# Patient Record
Sex: Female | Born: 1980 | Race: White | Hispanic: No | Marital: Married | Smoking: Former smoker
Health system: Southern US, Community
[De-identification: ages and names within clinical notes are randomized; demographics above are authoritative.]

## PROBLEM LIST (undated history)

## (undated) DIAGNOSIS — R51 Headache: Secondary | ICD-10-CM

## (undated) DIAGNOSIS — R519 Headache, unspecified: Secondary | ICD-10-CM

## (undated) DIAGNOSIS — I369 Nonrheumatic tricuspid valve disorder, unspecified: Secondary | ICD-10-CM

## (undated) DIAGNOSIS — I341 Nonrheumatic mitral (valve) prolapse: Secondary | ICD-10-CM

## (undated) DIAGNOSIS — R011 Cardiac murmur, unspecified: Secondary | ICD-10-CM

## (undated) DIAGNOSIS — Q228 Other congenital malformations of tricuspid valve: Secondary | ICD-10-CM

## (undated) HISTORY — PX: ABDOMINAL HYSTERECTOMY: SHX81

---
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
## 1999-01-17 HISTORY — PX: DILATION AND CURETTAGE OF UTERUS: SHX78

## 2000-01-17 HISTORY — PX: WISDOM TOOTH EXTRACTION: SHX21

## 2004-01-29 ENCOUNTER — Ambulatory Visit: Payer: Self-pay | Admitting: Obstetrics and Gynecology

## 2004-12-23 ENCOUNTER — Ambulatory Visit: Payer: Self-pay | Admitting: Obstetrics and Gynecology

## 2005-03-20 ENCOUNTER — Emergency Department: Payer: Self-pay | Admitting: Internal Medicine

## 2005-03-20 ENCOUNTER — Other Ambulatory Visit: Payer: Self-pay

## 2005-05-10 ENCOUNTER — Ambulatory Visit: Payer: Self-pay | Admitting: Obstetrics and Gynecology

## 2005-08-11 ENCOUNTER — Observation Stay: Payer: Self-pay

## 2005-08-14 ENCOUNTER — Inpatient Hospital Stay: Payer: Self-pay | Admitting: Obstetrics and Gynecology

## 2006-01-02 ENCOUNTER — Other Ambulatory Visit: Payer: Self-pay

## 2007-01-15 ENCOUNTER — Ambulatory Visit: Payer: Self-pay

## 2007-04-15 ENCOUNTER — Ambulatory Visit: Payer: Self-pay | Admitting: Anesthesiology

## 2007-05-16 ENCOUNTER — Ambulatory Visit: Payer: Self-pay | Admitting: Anesthesiology

## 2007-06-05 ENCOUNTER — Ambulatory Visit: Payer: Self-pay | Admitting: Anesthesiology

## 2007-06-20 ENCOUNTER — Ambulatory Visit: Payer: Self-pay | Admitting: Anesthesiology

## 2007-06-24 ENCOUNTER — Ambulatory Visit: Payer: Self-pay | Admitting: Anesthesiology

## 2010-03-05 ENCOUNTER — Ambulatory Visit: Payer: Self-pay | Admitting: Internal Medicine

## 2011-10-06 ENCOUNTER — Ambulatory Visit: Payer: Self-pay | Admitting: Family

## 2011-10-12 ENCOUNTER — Ambulatory Visit: Payer: Self-pay | Admitting: Family

## 2013-06-18 ENCOUNTER — Emergency Department: Payer: Self-pay | Admitting: Emergency Medicine

## 2013-06-18 LAB — URINALYSIS, COMPLETE
BACTERIA: NONE SEEN
BLOOD: NEGATIVE
Bilirubin,UR: NEGATIVE
Glucose,UR: NEGATIVE mg/dL (ref 0–75)
KETONE: NEGATIVE
LEUKOCYTE ESTERASE: NEGATIVE
Nitrite: NEGATIVE
PH: 7 (ref 4.5–8.0)
Protein: NEGATIVE
RBC,UR: 1 /HPF (ref 0–5)
Specific Gravity: 1.004 (ref 1.003–1.030)
Squamous Epithelial: NONE SEEN
WBC UR: <1 /HPF (ref 0–5)

## 2013-06-18 LAB — CBC
HCT: 39.4 %
HGB: 13.1 g/dL
MCH: 31.8 pg
MCHC: 33.3 g/dL
MCV: 96 fL
Platelet: 170 x10 3/mm 3
RBC: 4.12 X10 6/mm 3
RDW: 13.9 %
WBC: 4.5 x10 3/mm 3

## 2013-06-18 LAB — COMPREHENSIVE METABOLIC PANEL WITH GFR
Albumin: 4.4 g/dL
Alkaline Phosphatase: 53 U/L
Anion Gap: 5 — ABNORMAL LOW
BUN: 8 mg/dL
Bilirubin,Total: 0.4 mg/dL
Calcium, Total: 8.8 mg/dL
Chloride: 106 mmol/L
Co2: 29 mmol/L
Creatinine: 0.66 mg/dL
EGFR (African American): >60
EGFR (Non-African Amer.): >60
Glucose: 85 mg/dL
Osmolality: 277
Potassium: 3.7 mmol/L
SGOT(AST): 17 U/L
SGPT (ALT): 15 U/L
Sodium: 140 mmol/L
Total Protein: 7.3 g/dL

## 2013-06-18 LAB — LIPASE, BLOOD: Lipase: 187 U/L

## 2015-11-05 DIAGNOSIS — G8929 Other chronic pain: Secondary | ICD-10-CM | POA: Insufficient documentation

## 2015-12-02 ENCOUNTER — Other Ambulatory Visit: Payer: Self-pay | Admitting: Obstetrics and Gynecology

## 2015-12-02 DIAGNOSIS — Z1231 Encounter for screening mammogram for malignant neoplasm of breast: Secondary | ICD-10-CM

## 2016-01-11 ENCOUNTER — Encounter
Admission: RE | Disposition: A | Discharge status: Home / Self Care | Payer: Self-pay | Source: Ambulatory Visit | Attending: Obstetrics & Gynecology

## 2016-01-11 ENCOUNTER — Encounter: Payer: Self-pay | Admitting: *Deleted

## 2016-01-11 ENCOUNTER — Ambulatory Visit: Payer: BLUE CROSS/BLUE SHIELD | Admitting: Anesthesiology

## 2016-01-11 ENCOUNTER — Ambulatory Visit
Admission: RE | Admit: 2016-01-11 | Discharge: 2016-01-11 | Disposition: A | Discharge status: Home / Self Care | Payer: BLUE CROSS/BLUE SHIELD | Source: Ambulatory Visit | Attending: Obstetrics & Gynecology | Admitting: Obstetrics & Gynecology

## 2016-01-11 DIAGNOSIS — Z882 Allergy status to sulfonamides status: Secondary | ICD-10-CM | POA: Insufficient documentation

## 2016-01-11 DIAGNOSIS — D251 Intramural leiomyoma of uterus: Secondary | ICD-10-CM | POA: Insufficient documentation

## 2016-01-11 DIAGNOSIS — Z87891 Personal history of nicotine dependence: Secondary | ICD-10-CM | POA: Diagnosis not present

## 2016-01-11 DIAGNOSIS — I341 Nonrheumatic mitral (valve) prolapse: Secondary | ICD-10-CM | POA: Diagnosis not present

## 2016-01-11 DIAGNOSIS — N938 Other specified abnormal uterine and vaginal bleeding: Secondary | ICD-10-CM | POA: Insufficient documentation

## 2016-01-11 DIAGNOSIS — Z888 Allergy status to other drugs, medicaments and biological substances status: Secondary | ICD-10-CM | POA: Diagnosis not present

## 2016-01-11 DIAGNOSIS — Z91040 Latex allergy status: Secondary | ICD-10-CM | POA: Diagnosis not present

## 2016-01-11 DIAGNOSIS — R011 Cardiac murmur, unspecified: Secondary | ICD-10-CM | POA: Diagnosis not present

## 2016-01-11 DIAGNOSIS — N838 Other noninflammatory disorders of ovary, fallopian tube and broad ligament: Secondary | ICD-10-CM | POA: Diagnosis not present

## 2016-01-11 HISTORY — DX: Headache: R51

## 2016-01-11 HISTORY — DX: Cardiac murmur, unspecified: R01.1

## 2016-01-11 HISTORY — PX: LAPAROSCOPIC HYSTERECTOMY: SHX1926

## 2016-01-11 HISTORY — DX: Nonrheumatic mitral (valve) prolapse: I34.1

## 2016-01-11 HISTORY — PX: LAPAROSCOPIC BILATERAL SALPINGECTOMY: SHX5889

## 2016-01-11 HISTORY — DX: Headache, unspecified: R51.9

## 2016-01-11 HISTORY — DX: Other congenital malformations of tricuspid valve: Q22.8

## 2016-01-11 HISTORY — DX: Nonrheumatic tricuspid valve disorder, unspecified: I36.9

## 2016-01-11 LAB — CBC
HCT: 39 % (ref 35.0–47.0)
Hemoglobin: 13.4 g/dL (ref 12.0–16.0)
MCH: 32 pg (ref 26.0–34.0)
MCHC: 34.3 g/dL (ref 32.0–36.0)
MCV: 93.2 fL (ref 80.0–100.0)
PLATELETS: 208 10*3/uL (ref 150–440)
RBC: 4.18 MIL/uL (ref 3.80–5.20)
RDW: 13.3 % (ref 11.5–14.5)
WBC: 5.9 10*3/uL (ref 3.6–11.0)

## 2016-01-11 LAB — TYPE AND SCREEN
ABO/RH(D): A POS
ANTIBODY SCREEN: NEGATIVE

## 2016-01-11 LAB — BASIC METABOLIC PANEL
ANION GAP: 7 (ref 5–15)
BUN: 10 mg/dL (ref 6–20)
CO2: 30 mmol/L (ref 22–32)
Calcium: 9.2 mg/dL (ref 8.9–10.3)
Chloride: 102 mmol/L (ref 101–111)
Creatinine, Ser: 0.51 mg/dL (ref 0.44–1.00)
GFR calc Af Amer: >60 mL/min (ref >60)
Glucose, Bld: 96 mg/dL (ref 65–99)
POTASSIUM: 3.6 mmol/L (ref 3.5–5.1)
SODIUM: 139 mmol/L (ref 135–145)

## 2016-01-11 LAB — POCT PREGNANCY, URINE: Preg Test, Ur: NEGATIVE

## 2016-01-11 SURGERY — HYSTERECTOMY, TOTAL, LAPAROSCOPIC
Anesthesia: General | Site: Abdomen | Wound class: Clean Contaminated

## 2016-01-11 MED ORDER — LIDOCAINE 2% (20 MG/ML) 5 ML SYRINGE
INTRAMUSCULAR | Status: AC
  Filled 2016-01-11: qty 5

## 2016-01-11 MED ORDER — FAMOTIDINE 20 MG PO TABS
ORAL_TABLET | ORAL | Status: AC
  Administered 2016-01-11: 20 mg via ORAL
  Filled 2016-01-11: qty 1

## 2016-01-11 MED ORDER — SEVOFLURANE IN SOLN
RESPIRATORY_TRACT | Status: AC
  Filled 2016-01-11: qty 250

## 2016-01-11 MED ORDER — FAMOTIDINE 20 MG PO TABS
20.0000 mg | ORAL_TABLET | Freq: Once | ORAL | Status: AC
  Administered 2016-01-11: 20 mg via ORAL

## 2016-01-11 MED ORDER — ACETAMINOPHEN 10 MG/ML IV SOLN
INTRAVENOUS | Status: DC | PRN
  Administered 2016-01-11: 1000 mg via INTRAVENOUS

## 2016-01-11 MED ORDER — SUGAMMADEX SODIUM 200 MG/2ML IV SOLN
INTRAVENOUS | Status: AC
  Filled 2016-01-11: qty 2

## 2016-01-11 MED ORDER — ONDANSETRON HCL 4 MG/2ML IJ SOLN
INTRAMUSCULAR | Status: DC | PRN
  Administered 2016-01-11: 4 mg via INTRAVENOUS

## 2016-01-11 MED ORDER — ACETAMINOPHEN 325 MG PO TABS: 650.0000 mg | ORAL_TABLET | ORAL | Status: DC | PRN

## 2016-01-11 MED ORDER — ROCURONIUM BROMIDE 100 MG/10ML IV SOLN
INTRAVENOUS | Status: DC | PRN
  Administered 2016-01-11: 50 mg via INTRAVENOUS

## 2016-01-11 MED ORDER — GABAPENTIN 300 MG PO CAPS
300.0000 mg | ORAL_CAPSULE | Freq: Once | ORAL | Status: AC
  Administered 2016-01-11: 300 mg via ORAL

## 2016-01-11 MED ORDER — OXYCODONE HCL 5 MG PO TABS
5.0000 mg | ORAL_TABLET | Freq: Four times a day (QID) | ORAL | Status: DC | PRN
  Administered 2016-01-11: 5 mg via ORAL
  Filled 2016-01-11: qty 1

## 2016-01-11 MED ORDER — FENTANYL CITRATE (PF) 100 MCG/2ML IJ SOLN
INTRAMUSCULAR | Status: AC
  Filled 2016-01-11: qty 2

## 2016-01-11 MED ORDER — GENTAMICIN SULFATE 40 MG/ML IJ SOLN
INTRAVENOUS | Status: AC
  Administered 2016-01-11: 11:00:00 via INTRAVENOUS
  Filled 2016-01-11: qty 7.5

## 2016-01-11 MED ORDER — FENTANYL CITRATE (PF) 100 MCG/2ML IJ SOLN
25.0000 ug | INTRAMUSCULAR | Status: AC | PRN
  Administered 2016-01-11 (×6): 25 ug via INTRAVENOUS

## 2016-01-11 MED ORDER — SUCCINYLCHOLINE CHLORIDE 200 MG/10ML IV SOSY
PREFILLED_SYRINGE | INTRAVENOUS | Status: AC
  Filled 2016-01-11: qty 10

## 2016-01-11 MED ORDER — BUPIVACAINE HCL (PF) 0.5 % IJ SOLN
INTRAMUSCULAR | Status: AC
  Filled 2016-01-11: qty 30

## 2016-01-11 MED ORDER — MIDAZOLAM HCL 2 MG/2ML IJ SOLN
INTRAMUSCULAR | Status: AC
  Filled 2016-01-11: qty 2

## 2016-01-11 MED ORDER — ONDANSETRON HCL 4 MG/2ML IJ SOLN
INTRAMUSCULAR | Status: AC
  Administered 2016-01-11: 4 mg via INTRAVENOUS
  Filled 2016-01-11: qty 2

## 2016-01-11 MED ORDER — HEPARIN SODIUM (PORCINE) 5000 UNIT/ML IJ SOLN
INTRAMUSCULAR | Status: AC
  Administered 2016-01-11: 5000 [IU] via SUBCUTANEOUS
  Filled 2016-01-11: qty 1

## 2016-01-11 MED ORDER — SUGAMMADEX SODIUM 200 MG/2ML IV SOLN
INTRAVENOUS | Status: DC | PRN
  Administered 2016-01-11: 130 mg via INTRAVENOUS

## 2016-01-11 MED ORDER — LIDOCAINE HCL (CARDIAC) 20 MG/ML IV SOLN
INTRAVENOUS | Status: DC | PRN
  Administered 2016-01-11: 50 mg via INTRAVENOUS

## 2016-01-11 MED ORDER — ACETAMINOPHEN 10 MG/ML IV SOLN
INTRAVENOUS | Status: AC
  Filled 2016-01-11: qty 100

## 2016-01-11 MED ORDER — ONDANSETRON HCL 4 MG/2ML IJ SOLN
INTRAMUSCULAR | Status: AC
  Filled 2016-01-11: qty 2

## 2016-01-11 MED ORDER — GABAPENTIN 300 MG PO CAPS
ORAL_CAPSULE | ORAL | Status: AC
  Administered 2016-01-11: 300 mg via ORAL
  Filled 2016-01-11: qty 1

## 2016-01-11 MED ORDER — ONDANSETRON HCL 4 MG/2ML IJ SOLN
4.0000 mg | Freq: Once | INTRAMUSCULAR | Status: AC | PRN
  Administered 2016-01-11: 4 mg via INTRAVENOUS

## 2016-01-11 MED ORDER — OXYCODONE HCL 5 MG PO TABS
ORAL_TABLET | ORAL | Status: AC
  Filled 2016-01-11: qty 1

## 2016-01-11 MED ORDER — PROPOFOL 10 MG/ML IV BOLUS
INTRAVENOUS | Status: AC
  Filled 2016-01-11: qty 20

## 2016-01-11 MED ORDER — MIDAZOLAM HCL 2 MG/2ML IJ SOLN
INTRAMUSCULAR | Status: DC | PRN
  Administered 2016-01-11: 2 mg via INTRAVENOUS

## 2016-01-11 MED ORDER — BUPIVACAINE HCL 0.5 % IJ SOLN
INTRAMUSCULAR | Status: DC | PRN
Start: 2016-01-11 — End: 2016-01-11
  Administered 2016-01-11: 5 mL

## 2016-01-11 MED ORDER — PHENYLEPHRINE HCL 10 MG/ML IJ SOLN
INTRAMUSCULAR | Status: DC | PRN
  Administered 2016-01-11 (×2): 80 ug via INTRAVENOUS
  Administered 2016-01-11 (×3): 40 ug via INTRAVENOUS

## 2016-01-11 MED ORDER — FENTANYL CITRATE (PF) 100 MCG/2ML IJ SOLN
INTRAMUSCULAR | Status: DC | PRN
  Administered 2016-01-11 (×2): 50 ug via INTRAVENOUS

## 2016-01-11 MED ORDER — LACTATED RINGERS IV SOLN
INTRAVENOUS | Status: DC
  Administered 2016-01-11: 11:00:00 via INTRAVENOUS

## 2016-01-11 MED ORDER — HEPARIN SODIUM (PORCINE) 5000 UNIT/ML IJ SOLN
5000.0000 [IU] | INTRAMUSCULAR | Status: AC
  Administered 2016-01-11: 5000 [IU] via SUBCUTANEOUS

## 2016-01-11 MED ORDER — ACETAMINOPHEN 650 MG RE SUPP
650.0000 mg | RECTAL | Status: DC | PRN
  Filled 2016-01-11: qty 1

## 2016-01-11 MED ORDER — FENTANYL CITRATE (PF) 100 MCG/2ML IJ SOLN
INTRAMUSCULAR | Status: AC
  Administered 2016-01-11: 25 ug via INTRAVENOUS
  Filled 2016-01-11: qty 2

## 2016-01-11 MED ORDER — PROPOFOL 10 MG/ML IV BOLUS
INTRAVENOUS | Status: DC | PRN
  Administered 2016-01-11: 150 mg via INTRAVENOUS

## 2016-01-11 MED ORDER — KETOROLAC TROMETHAMINE 30 MG/ML IJ SOLN
30.0000 mg | Freq: Four times a day (QID) | INTRAMUSCULAR | Status: DC
  Filled 2016-01-11 (×5): qty 1

## 2016-01-11 MED ORDER — DEXAMETHASONE SODIUM PHOSPHATE 10 MG/ML IJ SOLN
INTRAMUSCULAR | Status: DC | PRN
  Administered 2016-01-11: 10 mg via INTRAVENOUS

## 2016-01-11 MED ORDER — DEXAMETHASONE SODIUM PHOSPHATE 10 MG/ML IJ SOLN
INTRAMUSCULAR | Status: AC
Start: 2016-01-11 — End: 2016-01-11
  Filled 2016-01-11: qty 1

## 2016-01-11 MED ORDER — MORPHINE SULFATE (PF) 4 MG/ML IV SOLN: 1.0000 mg | INTRAVENOUS | Status: DC | PRN

## 2016-01-11 SURGICAL SUPPLY — 65 items
BACTOSHIELD CHG 4% 4OZ (MISCELLANEOUS) ×2
BAG URO DRAIN 2000ML W/SPOUT (MISCELLANEOUS) ×4 IMPLANT
BLADE SURG SZ11 CARB STEEL (BLADE) ×4 IMPLANT
CANISTER SUCT 1200ML W/VALVE (MISCELLANEOUS) ×4 IMPLANT
CATH FOLEY 2WAY  5CC 16FR (CATHETERS) ×2
CATH ROBINSON RED A/P 16FR (CATHETERS) ×4 IMPLANT
CATH URTH 16FR FL 2W BLN LF (CATHETERS) ×2 IMPLANT
CHLORAPREP W/TINT 26ML (MISCELLANEOUS) ×8 IMPLANT
DEFOGGER SCOPE WARMER CLEARIFY (MISCELLANEOUS) ×4 IMPLANT
DERMABOND ADVANCED (GAUZE/BANDAGES/DRESSINGS) ×2
DERMABOND ADVANCED .7 DNX12 (GAUZE/BANDAGES/DRESSINGS) ×2 IMPLANT
DEVICE SUTURE ENDOST 10MM (ENDOMECHANICALS) IMPLANT
DRAPE LEGGINS SURG 28X43 STRL (DRAPES) ×4 IMPLANT
DRAPE SHEET LG 3/4 BI-LAMINATE (DRAPES) ×4 IMPLANT
DRAPE UNDER BUTTOCK W/FLU (DRAPES) ×4 IMPLANT
DRESSING TELFA 4X3 1S ST N-ADH (GAUZE/BANDAGES/DRESSINGS) ×12 IMPLANT
DRSG TEGADERM 2-3/8X2-3/4 SM (GAUZE/BANDAGES/DRESSINGS) ×8 IMPLANT
ENDOPOUCH RETRIEVER 10 (MISCELLANEOUS) IMPLANT
GAUZE SPONGE NON-WVN 2X2 STRL (MISCELLANEOUS) ×4 IMPLANT
GLOVE PI ORTHOPRO 6.5 (GLOVE) ×2
GLOVE PI ORTHOPRO STRL 6.5 (GLOVE) ×2 IMPLANT
GLOVE SURG SYN 6.5 ES PF (GLOVE) ×12 IMPLANT
GOWN STRL REUS W/ TWL LRG LVL3 (GOWN DISPOSABLE) ×6 IMPLANT
GOWN STRL REUS W/ TWL XL LVL3 (GOWN DISPOSABLE) ×2 IMPLANT
GOWN STRL REUS W/TWL LRG LVL3 (GOWN DISPOSABLE) ×6
GOWN STRL REUS W/TWL XL LVL3 (GOWN DISPOSABLE) ×2
GRASPER SUT TROCAR 14GX15 (MISCELLANEOUS) ×4 IMPLANT
IRRIGATION STRYKERFLOW (MISCELLANEOUS) ×2 IMPLANT
IRRIGATOR STRYKERFLOW (MISCELLANEOUS) ×4
IV LACTATED RINGERS 1000ML (IV SOLUTION) ×4 IMPLANT
KIT PINK PAD W/HEAD ARE REST (MISCELLANEOUS) ×4
KIT PINK PAD W/HEAD ARM REST (MISCELLANEOUS) ×2 IMPLANT
KIT RM TURNOVER CYSTO AR (KITS) ×4 IMPLANT
L-HOOK LAP DISP 36CM (ELECTROSURGICAL) ×4
LABEL OR SOLS (LABEL) IMPLANT
LHOOK LAP DISP 36CM (ELECTROSURGICAL) ×2 IMPLANT
LIGASURE BLUNT 5MM 37CM (INSTRUMENTS) ×4 IMPLANT
LIQUID BAND (GAUZE/BANDAGES/DRESSINGS) ×4 IMPLANT
MANIPULATOR UTERINE 4.5 ZUMI (MISCELLANEOUS) IMPLANT
MANIPULATOR VCARE LG CRV RETR (MISCELLANEOUS) IMPLANT
MANIPULATOR VCARE SML CRV RETR (MISCELLANEOUS) IMPLANT
MANIPULATOR VCARE STD CRV RETR (MISCELLANEOUS) IMPLANT
NEEDLE VERESS 14GA 120MM (NEEDLE) ×4 IMPLANT
NS IRRIG 500ML POUR BTL (IV SOLUTION) ×4 IMPLANT
PACK LAP CHOLECYSTECTOMY (MISCELLANEOUS) ×4 IMPLANT
PAD OB MATERNITY 4.3X12.25 (PERSONAL CARE ITEMS) ×4 IMPLANT
PAD PREP 24X41 OB/GYN DISP (PERSONAL CARE ITEMS) ×4 IMPLANT
PENCIL ELECTRO HAND CTR (MISCELLANEOUS) ×4 IMPLANT
SCISSORS METZENBAUM CVD 33 (INSTRUMENTS) IMPLANT
SCRUB CHG 4% DYNA-HEX 4OZ (MISCELLANEOUS) ×2 IMPLANT
SET CYSTO W/LG BORE CLAMP LF (SET/KITS/TRAYS/PACK) IMPLANT
SLEEVE ENDOPATH XCEL 5M (ENDOMECHANICALS) ×8 IMPLANT
SPONGE VERSALON 2X2 STRL (MISCELLANEOUS) ×4
SURGILUBE 2OZ TUBE FLIPTOP (MISCELLANEOUS) ×4 IMPLANT
SUT ENDO VLOC 180-0-8IN (SUTURE) IMPLANT
SUT MNCRL AB 4-0 PS2 18 (SUTURE) ×4 IMPLANT
SUT VIC AB 0 CT1 36 (SUTURE) ×4 IMPLANT
SUT VIC AB 2-0 UR6 27 (SUTURE) ×4 IMPLANT
SUT VIC AB 4-0 PS2 18 (SUTURE) ×4 IMPLANT
SYRINGE 10CC LL (SYRINGE) ×4 IMPLANT
TROCAR BLUNT TIP 12MM OMST12BT (TROCAR) IMPLANT
TROCAR ENDO BLADELESS 11MM (ENDOMECHANICALS) ×4 IMPLANT
TROCAR XCEL NON-BLD 5MMX100MML (ENDOMECHANICALS) ×4 IMPLANT
TUBING INSUFFLATOR HEATED (MISCELLANEOUS) ×4 IMPLANT
TUBING INSUFFLATOR HI FLOW (MISCELLANEOUS) ×4 IMPLANT

## 2016-01-11 NOTE — Interval H&P Note (Signed)
History and Physical Interval Note:  01/11/2016 9:56 AM  Janice Vargas  has presented today for surgery, with the diagnosis of AUB  Fibroid  The various methods of treatment have been discussed with the patient and family. After consideration of risks, benefits and other options for treatment, the patient has consented to  Procedure(s): HYSTERECTOMY TOTAL LAPAROSCOPIC (N/A) LAPAROSCOPIC BILATERAL SALPINGECTOMY (Bilateral) as a surgical intervention .  The patient's history has been reviewed, patient examined, no change in status, stable for surgery.  I have reviewed the patient's chart and labs.  Questions were answered to the patient's satisfaction.     Keener

## 2016-01-11 NOTE — Transfer of Care (Signed)
Immediate Anesthesia Transfer of Care Note  Patient: Janice Vargas  Procedure(s) Performed: Procedure(s): HYSTERECTOMY TOTAL LAPAROSCOPIC (N/A) LAPAROSCOPIC BILATERAL SALPINGECTOMY (Bilateral)  Patient Location: PACU  Anesthesia Type:General  Level of Consciousness: awake, alert  and oriented  Airway & Oxygen Therapy: Patient connected to face mask oxygen  Post-op Assessment: Post -op Vital signs reviewed and stable  Post vital signs: stable  Last Vitals:  Vitals:   01/11/16 0943 01/11/16 1235  BP: 122/88   Pulse: 67 80  Resp: 16 16  Temp: 36.2 C 36.2 C    Last Pain:  Vitals:   01/11/16 1235  TempSrc: Temporal      Patients Stated Pain Goal: 0 ([id number])  Complications: No apparent anesthesia complications

## 2016-01-11 NOTE — Anesthesia Procedure Notes (Signed)
Procedure Name: Intubation Date/Time: 01/11/2016 11:04 AM Performed by: Aline Brochure Pre-anesthesia Checklist: Patient identified, Emergency Drugs available, Suction available and Patient being monitored Oxygen Delivery Method: Circle system utilized Preoxygenation: Pre-oxygenation with 100% oxygen Intubation Type: IV induction Ventilation: Mask ventilation without difficulty Laryngoscope Size: Mac and 3 Grade View: Grade II Tube type: Oral Tube size: 7.0 mm Number of attempts: 1 Airway Equipment and Method: Stylet Placement Confirmation: ETT inserted through vocal cords under direct vision,  positive ETCO2 and breath sounds checked- equal and bilateral Secured at: 21 cm Tube secured with: Tape Dental Injury: Teeth and Oropharynx as per pre-operative assessment  Difficulty Due To: Difficult Airway- due to anterior larynx

## 2016-01-11 NOTE — H&P (Signed)
Patient ID: Janice Vargas is a 35 y.o. female who presents with abnormal uterine bleeding.    HPI: She has had excessive menses for years, and is finally ready to do something about it.  She has tried hormonal manipulation, and was recently seen by Lars Pinks CNM and recommended an ultrasound for further evaluation, for potential ablation or surgical intervention.  She had her ultrasound today that showed a large, cavity-distorting fibroid in the anterior uterus, and is thus not a candidate for ablation. She asks for hysterectomy.   Past Medical History:  has a past medical history of Chronic headaches; GA (granuloma annulare); Heart murmur, unspecified; and Low back pain.  Past Surgical History:  has a past surgical history that includes ankle surgery. Family History: family history includes Breast cancer in her other; Diabetes in her father; Fibrocystic breast disease in her maternal aunt; High blood pressure (Hypertension) in her father. Social History:  reports that she has never smoked. She has never used smokeless tobacco. She reports that she does not drink alcohol or use drugs. OB/GYN History:          OB History    Gravida Para Term Preterm AB Living   3 2 2   1 2    SAB TAB Ectopic Molar Multiple Live Births                    Allergies: is allergic to sulfa (sulfonamide antibiotics); wellbutrin [bupropion hcl]; omnicef [cefdinir]; and latex. Medications:  Current Outpatient Prescriptions:  .  benzonatate (TESSALON) 100 MG capsule, Take 1 capsule (100 mg total) by mouth 3 (three) times daily as needed for Cough for up to 10 days., Disp: 30 capsule, Rfl: 0 .  cetirizine (ZYRTEC) 10 MG tablet, Take 10 mg by mouth once daily., Disp: , Rfl:  .  doxycycline (VIBRAMYCIN) 100 MG capsule, Take 1 capsule (100 mg total) by mouth 2 (two) times daily for 7 days., Disp: 14 capsule, Rfl: 0 .  fluticasone (FLONASE) 50 mcg/actuation nasal spray, Place 1 spray into both nostrils 2  (two) times daily., Disp: 16 g, Rfl: 0   Review of Systems: No SOB, no palpitations or chest pain, no new lower extremity edema, no nausea or vomiting or bowel or bladder complaints. See HPI for gyn specific ROS.   Exam:   BP 106/68   Pulse 81   Ht 162.6 cm (5\' 4" )   Wt 62.6 kg (138 lb)   BMI 23.69 kg/m   General: Patient is well-groomed, well-nourished, appears stated age in no acute distress  HEENT: head is atraumatic and normocephalic, trachea is midline, neck is supple with no palpable nodules  CV: Regular rhythm and normal heart rate, 2+ systolic murmur  Pulm: Clear to auscultation throughout lung fields with no wheezing, crackles, or rhonchi. 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    Plan:   The patient and I discussed the technical aspects of the procedure including the potential for risks and complications.These include but are not limited to the risk of infection requiring post-operative antibiotics or further procedures.We talked about the risk of injury to adjacent organs including bladder, bowel, ureter, blood vessels or nerves.We talked about the need to convert to an open incision.We talked about the possibleneed for blood transfusion.We talked aboutpostop complications such asthromboembolic or cardiopulmonary complications.All of her questions were answered. Her preoperative exam was  completed andthe appropriate consents were signed. She is scheduled to undergo this procedure in the near future.  We will attempt laparoscopic hysterectomy, with possible open/mini-lap for removal if not able to be delivered vaginally.       CHELSEA CRIST WARD, MD

## 2016-01-11 NOTE — Discharge Instructions (Addendum)
Discharge instructions:  Call office if you have any of the following: fever >101 F, chills, excessive vaginal bleeding, incision drainage or problems, leg pain or redness, or any other concerns.   Activity: Do not lift > 10 lbs for 8 weeks.  No intercourse or tampons for 8 weeks.  No driving for 1-2 weeks.   Please don't limit yourself in terms of routine activity.  You will be able to do most things, although they may take longer to do or be a little painful.  You can do it!  Don't be a hero, but don't be a wimp either!   AMBULATORY SURGERY  DISCHARGE INSTRUCTIONS   1) The drugs that you were given will stay in your system until tomorrow so for the next 24 hours you should not:  A) Drive an automobile B) Make any legal decisions C) Drink any alcoholic beverage   2) You may resume regular meals tomorrow.  Today it is better to start with liquids and gradually work up to solid foods.  You may eat anything you prefer, but it is better to start with liquids, then soup and crackers, and gradually work up to solid foods.   3) Please notify your doctor immediately if you have any unusual bleeding, trouble breathing, redness and pain at the surgery site, drainage, fever, or pain not relieved by medication.    4) Additional Instructions:        Please contact your physician with any problems or Same Day Surgery at [phone], Monday through Friday 6 am to 4 pm, or [place] at [facility] number at [phone].  RX GIVEN FOR OXYCODONE 5MG  - TAKE 1 EVERY 4 HOURS AS NEEDED FOR PAIN

## 2016-01-11 NOTE — Anesthesia Preprocedure Evaluation (Signed)
Anesthesia Evaluation  Patient identified by MRN, date of birth, ID band Patient awake    Reviewed: Allergy & Precautions, H&P , NPO status , Patient's Chart, lab work & pertinent test results, reviewed documented beta blocker date and time   Airway Mallampati: II  TM Distance: >3 FB Neck ROM: full    Dental  (+) Teeth Intact   Pulmonary neg pulmonary ROS, former smoker,    Pulmonary exam normal        Cardiovascular negative cardio ROS Normal cardiovascular exam+ Valvular Problems/Murmurs  Rhythm:regular Rate:Normal     Neuro/Psych  Headaches, negative neurological ROS  negative psych ROS   GI/Hepatic negative GI ROS, Neg liver ROS,   Endo/Other  negative endocrine ROS  Renal/GU negative Renal ROS  negative genitourinary   Musculoskeletal   Abdominal   Peds  Hematology negative hematology ROS (+)   Anesthesia Other Findings Past Medical History: No date: Headache     Comment: high pressure headaches but does not take               anything anymore No date: Heart murmur     Comment: not being followed No date: Mitral valve prolapse 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 Past Surgical History: 2001: DILATION AND CURETTAGE OF UTERUS 1999: FRACTURE SURGERY Bilateral     Comment: screws and plates in both feet [id number]-GRM: OVARIAN CYST REMOVAL 2002: WISDOM TOOTH EXTRACTION BMI    Body Mass Index:  23.00 kg/m     Reproductive/Obstetrics negative OB ROS                             Anesthesia Physical Anesthesia Plan  ASA: II  Anesthesia Plan: General ETT   Post-op Pain Management:    Induction:   Airway Management Planned:   Additional Equipment:   Intra-op Plan:   Post-operative Plan:   Informed Consent: I have reviewed the patients History and Physical, chart, labs and discussed the procedure including the risks, benefits and alternatives for the proposed anesthesia with the  patient or authorized representative who has indicated his/her understanding and acceptance.   Dental Advisory Given  Plan Discussed with: CRNA  Anesthesia Plan Comments:         Anesthesia Quick Evaluation

## 2016-01-11 NOTE — Op Note (Addendum)
Total Laparoscopic Hysterectomy Operative Note Procedure Date: 01/11/2016  Patient:  Janice Vargas  35 y.o. female  PRE-OPERATIVE DIAGNOSIS:  AUB  Fibroid  POST-OPERATIVE DIAGNOSIS:  AUB  Fibroid  PROCEDURE:  Procedure(s): HYSTERECTOMY TOTAL LAPAROSCOPIC (N/A) LAPAROSCOPIC BILATERAL SALPINGECTOMY (Bilateral)  SURGEON:  Surgeon(s) and Role:    * Anetra Czerwinski C Yacob Wilkerson, MD - Primary    * Boykin Nearing, MD - assisting  ANESTHESIA:  General via ET  I/O  Total I/O In: -  Out: 150 [Urine:100; Blood:50]  FINDINGS:  Bulbous uterus, normal ovaries and fallopian tubes bilaterally.  Normal upper abdomen.  SPECIMEN: Uterus, Cervix, and bilateral fallopian tubes  COMPLICATIONS: none apparent  DISPOSITION: vital signs stable to PACU  Indication for Surgery: 35 y.o. with heavy bleeding interfering with daily life and failed hormonal treatments requesting hysterectomy for definitive treatment.  Risks of surgery were discussed with the patient including but not limited to: bleeding which may require transfusion or reoperation; infection which may require antibiotics; injury to bowel, bladder, ureters or other surrounding organs; need for additional procedures including laparotomy, blood clot, incisional problems and other postoperative/anesthesia complications. Written informed consent was obtained.      PROCEDURE IN DETAIL:  The patient had 5000u Heparin Sub-q and sequential compression devices applied to her lower extremities while in the preoperative area.  She was then taken to the operating room where general anesthesia was administered via endotracheal route.  She was placed in the dorsal lithotomy position, and was prepped and draped in a sterile manner. A surgical time-out was performed.  A Foley catheter was inserted into her bladder and attached to constant drainage and a V-Care uterine manipulator was then advanced into the uterus and a good fit around the cervix was noted. The gloves  were changed, and attention was turned to the abdomen where an umbilical incision was made with the scalpel.  A 58mm trochar was inserted in the umbilical incision using a visiport method.Opening pressure was 76mmHg, and the abdomen was insufflated to 15mg Hg carbon dioxide gas and adequate pneumoperitoneum was obtained. A survey of the patient's pelvis and abdomen revealed the findings as mentioned above. Two 52mm ports were inserted in the lower left and right quadrants under visualization.    The bilateral fallopian tubes were separated from the mesosalpinx using the Ligasure. The bilateral round ligaments were transected and anterior broad ligament divided and brought across the uterus to separate the vesicouterine peritoneum and create a bladder flap. The bladder was pushed away from the uterus. The bilateral uterine arteries were skeletonized, ligated and transected. The bilateral uterosacral and cardinal ligaments were ligated and transected. A colpotomy was made around the V-Care cervical cup and the uterus, cervix, and bilateral tubes were removed through the vagina. The vaginal cuff was closed vaginally using 0-Vicryl in a running locking stitch. This was tested for integrity using the surgeon's finger. After a change of gloves, the pneumoperitoneum was recreated and surgical site inspected, and found to be hemostatic. Bilateral ureters were visualized vermiuclating. No intraoperative injury to surrounding organs was noted. The abdomen was desufflated and all instruments were then removed.   All skin incisions were closed with 4-0 monocryl and covered with surgical glue. The patient tolerated the procedures well.  All instruments, needles, and sponge counts were correct x 2. The patient was taken to the recovery room in stable condition.   ---- Larey Days, MD Attending Obstetrician and [facility]

## 2016-01-13 ENCOUNTER — Ambulatory Visit: Payer: Self-pay

## 2016-01-13 LAB — SURGICAL PATHOLOGY

## 2016-01-25 NOTE — Anesthesia Postprocedure Evaluation (Signed)
Anesthesia Post Note  Patient: Kassie M Kasel  Procedure(s) Performed: Procedure(s) (LRB): HYSTERECTOMY TOTAL LAPAROSCOPIC (N/A) LAPAROSCOPIC BILATERAL SALPINGECTOMY (Bilateral)  Patient location during evaluation: PACU Anesthesia Type: General Level of consciousness: awake and alert Pain management: pain level controlled Vital Signs Assessment: post-procedure vital signs reviewed and stable Respiratory status: spontaneous breathing, nonlabored ventilation, respiratory function stable and patient connected to nasal cannula oxygen Cardiovascular status: blood pressure returned to baseline and stable Postop Assessment: no signs of nausea or vomiting Anesthetic complications: no     Last Vitals:  Vitals:   01/11/16 1336 01/11/16 1443  BP: (!) 95/40 (!) 104/58  Pulse: (!) 57 62  Resp: 16 16  Temp: 36.9 C     Last Pain:  Vitals:   01/12/16 0836  TempSrc:   PainSc: [place]

## 2016-11-07 DIAGNOSIS — S92919A Unspecified fracture of unspecified toe(s), initial encounter for closed fracture: Secondary | ICD-10-CM | POA: Insufficient documentation

## 2017-01-02 ENCOUNTER — Other Ambulatory Visit: Payer: Self-pay | Admitting: General Surgery

## 2017-01-02 DIAGNOSIS — R1011 Right upper quadrant pain: Secondary | ICD-10-CM

## 2018-02-01 ENCOUNTER — Encounter: Payer: Self-pay | Admitting: Emergency Medicine

## 2018-02-01 ENCOUNTER — Other Ambulatory Visit: Payer: Self-pay

## 2018-02-01 ENCOUNTER — Ambulatory Visit
Admission: EM | Admit: 2018-02-01 | Discharge: 2018-02-01 | Disposition: A | Discharge status: Home / Self Care | Payer: BLUE CROSS/BLUE SHIELD | Attending: Family Medicine | Admitting: Family Medicine

## 2018-02-01 DIAGNOSIS — R0981 Nasal congestion: Secondary | ICD-10-CM

## 2018-02-01 DIAGNOSIS — B9789 Other viral agents as the cause of diseases classified elsewhere: Secondary | ICD-10-CM | POA: Diagnosis not present

## 2018-02-01 DIAGNOSIS — J40 Bronchitis, not specified as acute or chronic: Secondary | ICD-10-CM | POA: Insufficient documentation

## 2018-02-01 DIAGNOSIS — J069 Acute upper respiratory infection, unspecified: Secondary | ICD-10-CM

## 2018-02-01 DIAGNOSIS — Z87891 Personal history of nicotine dependence: Secondary | ICD-10-CM

## 2018-02-01 DIAGNOSIS — J209 Acute bronchitis, unspecified: Secondary | ICD-10-CM

## 2018-02-01 DIAGNOSIS — R05 Cough: Secondary | ICD-10-CM

## 2018-02-01 MED ORDER — HYDROCOD POLST-CPM POLST ER 10-8 MG/5ML PO SUER: 5.0000 mL | Freq: Every evening | ORAL | 0 refills | Status: DC | PRN

## 2018-02-01 MED ORDER — DOXYCYCLINE HYCLATE 100 MG PO CAPS: 100.0000 mg | ORAL_CAPSULE | Freq: Two times a day (BID) | ORAL | 0 refills | Status: DC

## 2018-02-01 MED ORDER — BENZONATATE 100 MG PO CAPS: 100.0000 mg | ORAL_CAPSULE | Freq: Three times a day (TID) | ORAL | 0 refills | Status: DC | PRN

## 2018-02-01 NOTE — ED Triage Notes (Signed)
Patient c/o cough and chest congestion since Dec. 31.   Patient denies recent fevers.

## 2018-02-01 NOTE — ED Provider Notes (Signed)
[facility] ____________________________________________  Time seen: Approximately 10:50 AM  I have reviewed the triage vital signs and the nursing notes.   HISTORY  Chief Complaint Cough (APPOINTMENT)   HPI Janice Vargas is a 38 y.o. female presenting for evaluation of nasal congestion, postnasal drainage, cough present for the last 2 to 3 weeks.  Patient reports symptoms started initially with sudden onset and felt feverish, but reports fevers have since resolved, but continues with cough and congestion.  Patient states cough is disrupting her sleep as well as daily activities.  Continues to overall eat and drink well.  Denies recent fevers.  Occasional throat irritation.  Intermittent sinus pressure.  Has history of seasonal allergies.  Unresolved with multiple over-the-counter cough and congestion agents.  Continues to remain active.  Denies chest pain or shortness of breath.  Does report intermittently feels winded with her cough.  No hemoptysis.  Denies other aggravating alleviating factors.  Reports otherwise doing well.  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  Past Medical History:  Diagnosis Date  . Headache    high pressure headaches but does not take anything anymore  . Heart murmur    not being followed  . Mitral valve prolapse   . Tricuspid valve prolapse     There are no active problems to display for this patient.   Past Surgical History:  Procedure Laterality Date  . ABDOMINAL HYSTERECTOMY    . DILATION AND CURETTAGE OF UTERUS  2001  . FRACTURE SURGERY Bilateral 1999   screws and plates in both feet  . LAPAROSCOPIC BILATERAL SALPINGECTOMY Bilateral 01/11/2016   Procedure: LAPAROSCOPIC BILATERAL SALPINGECTOMY;  Surgeon: Honor Loh Ward, MD;  Location: [facility] ORS;  Service: Gynecology;  Laterality: Bilateral;  . LAPAROSCOPIC HYSTERECTOMY N/A 01/11/2016   Procedure: HYSTERECTOMY TOTAL LAPAROSCOPIC;  Surgeon: Honor Loh  Ward, MD;  Location: [facility] ORS;  Service: Gynecology;  Laterality: N/A;  . OVARIAN CYST REMOVAL  1986  . WISDOM TOOTH EXTRACTION  2002     No current facility-administered medications for this encounter.   Current Outpatient Medications:  .  cetirizine (ZYRTEC) 10 MG tablet, Take 10 mg by mouth daily., Disp: , Rfl:  .  fluticasone (FLONASE) 50 MCG/ACT nasal spray, Place 1 spray into both nostrils daily as needed for allergies., Disp: , Rfl: 0 .  ibuprofen (ADVIL,MOTRIN) 200 MG tablet, Take 600-800 mg by mouth every 6 (six) hours as needed for headache or moderate pain., Disp: , Rfl:  .  Multiple Vitamin (MULTIVITAMIN WITH MINERALS) TABS tablet, Take 1 tablet by mouth daily., Disp: , Rfl:  .  benzonatate (TESSALON PERLES) 100 MG capsule, Take 1 capsule (100 mg total) by mouth 3 (three) times daily as needed for cough., Disp: 15 capsule, Rfl: 0 .  chlorpheniramine-HYDROcodone (TUSSIONEX PENNKINETIC ER) 10-8 MG/5ML SUER, Take 5 mLs by mouth at bedtime as needed for cough. do not drive or operate machinery while taking as can cause drowsiness., Disp: 50 mL, Rfl: 0 .  doxycycline (VIBRAMYCIN) 100 MG capsule, Take 1 capsule (100 mg total) by mouth 2 (two) times daily., Disp: 20 capsule, Rfl: 0  Allergies Sulfa antibiotics; Bupropion; Latex; and Omnicef [cefdinir]  Family History  Problem Relation Age of Onset  . Diabetic kidney disease Father   . Hypertension Father   . Seizures Son     Social History Social History   Tobacco Use  . Smoking status: Former Smoker    Last attempt to quit: 02/15/2001  Years since quitting: 16.9  . Smokeless tobacco: Never Used  Substance Use Topics  . Alcohol use: Yes    Comment: socially  . Drug use: No    Review of Systems Constitutional: No recent fever. ENT: as above. Cardiovascular: Denies chest pain. Respiratory: Denies shortness of breath. Gastrointestinal: No abdominal pain. Musculoskeletal: Negative for back pain. Skin: Negative for  rash.   ____________________________________________   PHYSICAL EXAM:  VITAL SIGNS: ED Triage Vitals  Enc Vitals Group     BP 02/01/18 0937 (!) 106/93     Pulse Rate 02/01/18 0937 86     Resp 02/01/18 0937 16     Temp 02/01/18 0937 98.4 F (36.9 C)     Temp Source 02/01/18 0937 Oral     SpO2 02/01/18 0937 100 %     Weight 02/01/18 0934 120 lb (54.4 kg)     Height 02/01/18 0934 5\' 4"  (1.626 m)     Head Circumference --      Peak Flow --      Pain Score 02/01/18 0934 4     Pain Loc --      Pain Edu? --      Excl. in [place]? --     Constitutional: Alert and oriented. Well appearing and in no acute distress. Eyes: Conjunctivae are normal.  Head: Atraumatic.  Mild bilateral maxillary sinus ridge palpation.  No frontal sinus tenderness palpation.  No swelling. No erythema.  Ears: no erythema, normal TMs bilaterally.   Nose:Nasal congestion with clear rhinorrhea  Mouth/Throat: Mucous membranes are moist. No pharyngeal erythema. No tonsillar swelling or exudate.  Neck: No stridor.  No cervical spine tenderness to palpation. Hematological/Lymphatic/Immunilogical: No cervical lymphadenopathy. Cardiovascular: Normal rate, regular rhythm. Grossly normal heart sounds.  Good peripheral circulation. Respiratory: Normal respiratory effort.  No retractions.  Mild scattered rhonchi.  No wheezes or rales.  Good air movement.  Gastrointestinal: Soft and nontender. Musculoskeletal: Ambulatory with steady gait.  Neurologic:  Normal speech and language. No gait instability. Skin:  Skin appears warm, dry and intact. No rash noted. Psychiatric: Mood and affect are normal. Speech and behavior are normal. ___________________________________________   LABS (all labs ordered are listed, but only abnormal results are displayed)  Labs Reviewed - No data to display   PROCEDURES Procedures     INITIAL IMPRESSION / ASSESSMENT AND PLAN / ED COURSE  Pertinent labs & imaging results that were  available during my care of the patient were reviewed by me and considered in my medical decision making (see chart for details).  Well-appearing patient.  No acute distress.  Suspect recent viral upper respiratory infection.  However as symptoms have persisted, will empirically treat with oral doxycycline, PRN Tessalon Perles and PRN Tussionex.  Encourage rest, fluids, supportive care, continue Mucinex.Discussed indication, risks and benefits of medications with patient.  Discussed follow up with Primary care physician this week. Discussed follow up and return parameters including no resolution or any worsening concerns. Patient verbalized understanding and agreed to plan.   ____________________________________________   FINAL CLINICAL IMPRESSION(S) / ED DIAGNOSES  Final diagnoses:  Viral URI with cough  Bronchitis     ED Discharge Orders         Ordered    chlorpheniramine-HYDROcodone (TUSSIONEX PENNKINETIC ER) 10-8 MG/5ML SUER  At bedtime PRN     02/01/18 1013    doxycycline (VIBRAMYCIN) 100 MG capsule  2 times daily     02/01/18 1013    benzonatate (TESSALON PERLES) 100 MG capsule  3 times daily PRN     02/01/18 1013           Note: This dictation was prepared with Dragon dictation along with smaller phrase technology. Any transcriptional errors that result from this process are unintentional.         Marylene Land, NP 02/01/18 1055

## 2018-02-01 NOTE — Discharge Instructions (Signed)
Take medication as prescribed. Rest. Drink plenty of fluids.  ° °Follow up with your primary care physician this week as needed. Return to Urgent care for new or worsening concerns.  ° °

## 2018-03-02 ENCOUNTER — Ambulatory Visit
Admission: EM | Admit: 2018-03-02 | Discharge: 2018-03-02 | Disposition: A | Discharge status: Home / Self Care | Payer: BLUE CROSS/BLUE SHIELD | Attending: Emergency Medicine | Admitting: Emergency Medicine

## 2018-03-02 DIAGNOSIS — J029 Acute pharyngitis, unspecified: Secondary | ICD-10-CM

## 2018-03-02 DIAGNOSIS — R05 Cough: Secondary | ICD-10-CM

## 2018-03-02 DIAGNOSIS — Z87891 Personal history of nicotine dependence: Secondary | ICD-10-CM | POA: Diagnosis not present

## 2018-03-02 DIAGNOSIS — R51 Headache: Secondary | ICD-10-CM | POA: Diagnosis not present

## 2018-03-02 DIAGNOSIS — R5383 Other fatigue: Secondary | ICD-10-CM

## 2018-03-02 LAB — RAPID STREP SCREEN (MED CTR MEBANE ONLY): STREPTOCOCCUS, GROUP A SCREEN (DIRECT): NEGATIVE

## 2018-03-02 MED ORDER — PENICILLIN G BENZATHINE 1200000 UNIT/2ML IM SUSP
1.2000 10*6.[IU] | Freq: Once | INTRAMUSCULAR | Status: AC
  Administered 2018-03-02: 1.2 10*6.[IU] via INTRAMUSCULAR

## 2018-03-02 MED ORDER — IBUPROFEN 600 MG PO TABS: 600.0000 mg | ORAL_TABLET | Freq: Four times a day (QID) | ORAL | 0 refills | Status: AC | PRN

## 2018-03-02 NOTE — ED Provider Notes (Signed)
HPI  SUBJECTIVE:  Patient reports sore throat starting 2 days ago. Sx worse with swallowing.  Sx better with think. Has been taking Tylenol and ibuprofen around-the-clock w/ o relief.  + Fever tmax 100.2 + Swollen neck glands   No neck stiffness, but states that her neck hurts from the sore throat No URI sxs Reports a cough since January when she hd a URI + Myalgias + Diffuse headache No Rash     No Recent Strep or mono exposure.  Daughter had flu 2 weeks ago. No Abdominal Pain No reflux sxs No Allergy sx-no change above her baseline No Breathing difficulty, voice changes, sensation of throat swelling shut No Drooling No Trismus She was treated for URI here last month with some antibiotics.  She took ibuprofen 600 mg within 4 to 6 hours of evaluation. No antipyretic in past 4-6 hrs Past medical history of allergies for which she takes Flonase, Zyrtec.  No history of diabetes, hypertension, frequent strep, asthma, mono.  LMP: Status post hysterectomy.   PMD: None.   Past Medical History:  Diagnosis Date  . Headache    high pressure headaches but does not take anything anymore  . Heart murmur    not being followed  . Mitral valve prolapse   . Tricuspid valve prolapse     Past Surgical History:  Procedure Laterality Date  . ABDOMINAL HYSTERECTOMY    . DILATION AND CURETTAGE OF UTERUS  2001  . FRACTURE SURGERY Bilateral 1999   screws and plates in both feet  . LAPAROSCOPIC BILATERAL SALPINGECTOMY Bilateral 01/11/2016   Procedure: LAPAROSCOPIC BILATERAL SALPINGECTOMY;  Surgeon: Honor Loh Ward, MD;  Location: [facility];  Service: Gynecology;  Laterality: Bilateral;  . LAPAROSCOPIC HYSTERECTOMY N/A 01/11/2016   Procedure: HYSTERECTOMY TOTAL LAPAROSCOPIC;  Surgeon: Honor Loh Ward, MD;  Location: [facility];  Service: Gynecology;  Laterality: N/A;  . OVARIAN CYST REMOVAL  1986  . WISDOM TOOTH EXTRACTION  2002    Family History  Problem Relation Age of Onset  . Diabetic  kidney disease Father   . Hypertension Father   . Seizures Son     Social History   Tobacco Use  . Smoking status: Former Smoker    Last attempt to quit: 02/15/2001    Years since quitting: 17.0  . Smokeless tobacco: Never Used  Substance Use Topics  . Alcohol use: Yes    Comment: socially  . Drug use: No     Current Facility-Administered Medications:  .  penicillin g benzathine (BICILLIN LA) 1200000 UNIT/2ML injection 1.2 Million Units, 1.2 Million Units, Intramuscular, Once, Melynda Ripple, MD  Current Outpatient Medications:  .  cetirizine (ZYRTEC) 10 MG tablet, Take 10 mg by mouth daily., Disp: , Rfl:  .  fluticasone (FLONASE) 50 MCG/ACT nasal spray, Place 1 spray into both nostrils daily as needed for allergies., Disp: , Rfl: 0 .  ibuprofen (ADVIL,MOTRIN) 600 MG tablet, Take 1 tablet (600 mg total) by mouth every 6 (six) hours as needed., Disp: 30 tablet, Rfl: 0 .  Multiple Vitamin (MULTIVITAMIN WITH MINERALS) TABS tablet, Take 1 tablet by mouth daily., Disp: , Rfl:   Allergies  Allergen Reactions  . Sulfa Antibiotics Hives and Shortness Of Breath  . Bupropion Hives  . Latex Rash    Patient thinks she had testing for latex allergy and it said "NO" but her skin says yes  . Omnicef [Cefdinir] Hives     ROS  As noted in HPI.   Physical Exam  BP  127/75 (BP Location: Left Arm)   Pulse 80   Temp 98 F (36.7 C) (Oral)   Resp 18   Ht 5\' 4"  (1.626 m)   Wt 56.2 kg   LMP 01/01/2016 (Exact Date)   SpO2 100%   BMI 21.28 kg/m   Constitutional: Well developed, well nourished, no acute distress Eyes:  EOMI, conjunctiva normal bilaterally HENT: Normocephalic, atraumatic,mucus membranes moist.  - nasal congestion + erythematous oropharynx - enlarged tonsils  - exudates. Uvula midline.  There is petechiae on palate and uvula.  Positive postnasal drip and cobblestoning Neck: No meningismus Respiratory: Normal inspiratory effort Cardiovascular: Normal rate, no murmurs,  rubs, gallops GI: nondistended, nontender. No appreciable splenomegaly skin: No rash, skin intact Lymph: + Anterior cervical LN.  No posterior cervical lymphadenopathy Musculoskeletal: no deformities Neurologic: Alert & oriented x 3, no focal neuro deficits Psychiatric: Speech and behavior appropriate.   ED Course   Medications  penicillin g benzathine (BICILLIN LA) 1200000 UNIT/2ML injection 1.2 Million Units (has no administration in time range)    Orders Placed This Encounter  Procedures  . Rapid Strep Screen ([facility] ONLY)    Standing Status:   Standing    Number of Occurrences:   1  . Culture, group A strep    Standing Status:   Standing    Number of Occurrences:   1    Results for orders placed or performed during the hospital encounter of 03/02/18 (from the past 24 hour(s))  Rapid Strep Screen ([facility] ONLY)     Status: None   Collection Time: 03/02/18  8:18 AM  Result Value Ref Range   Streptococcus, Group A Screen (Direct) NEGATIVE NEGATIVE   No results found.  ED Clinical Impression  Pharyngitis, unspecified etiology   ED Assessment/Plan  No evidence of a pharyngeal abscess, peritonsillar abscess, other deep space infection.  Rapid strep negative.  Discussed a wait-and-see option versus treating today for strep throat.  Given the petechiae on palate, cervical lymphadenopathy, fever, will treat empirically for strep with 1,200,000 units of Bicillin IM x1 here.  Patient home with ibuprofen, Tylenol, Benadryl/Maalox mixture.. Patient to followup with PMD of choice when necessary, go the ER if she gets worse.  Giving primary care referral list.  Discussed labs,  MDM, plan and followup with patient. Discussed sn/sx that should prompt return to the ED. patient agrees with plan.   Meds ordered this encounter  Medications  . penicillin g benzathine (BICILLIN LA) 1200000 UNIT/2ML injection 1.2 Million Units    Order Specific Question:   Antibiotic  Indication:    Answer:   Pharyngitis  . ibuprofen (ADVIL,MOTRIN) 600 MG tablet    Sig: Take 1 tablet (600 mg total) by mouth every 6 (six) hours as needed.    Dispense:  30 tablet    Refill:  0     *This clinic note was created using [employer]. Therefore, there may be occasional mistakes despite careful proofreading.     Melynda Ripple, MD 03/03/18 [phone]

## 2018-03-02 NOTE — ED Triage Notes (Signed)
Pt here for sore throat since thur. That hasn't gone away. Also complains of headache and neck pain. Reports fever but not one today. Has been taking ibuprofen and tylenol.

## 2018-03-02 NOTE — Discharge Instructions (Addendum)
your rapid strep was negative today, so we have sent off a throat culture.  We have treated you empirically for strep throat, so if it comes back positive you do not need any more antibiotics .  1 gram of Tylenol and 600 mg ibuprofen together 3-4 times a day as needed for pain.  Make sure you drink plenty of extra fluids.  Some people find salt water gargles and  Traditional Medic[employer]'s "Throat Coat" tea helpful. Take 5 mL of liquid Benadryl and 5 mL of Maalox. Mix it together, and then hold it in your mouth for as long as you can and then swallow. You may do this 4 times a day.    Here is a list of primary care providers who are taking new patients:  Dr. Otilio Miu, Dr. Adline Potter [address] [place] [address] [place] [address]  [phone]  [facility] [address] [place], [place] [address] [phone]  [facility] [place]  [id number] [place], [place] [address]  Here are clinics/ other resources who will see you if you do not have insurance. Some have certain criteria that you must meet. Call them and find out what they are:  [facility]: [address] [place], [place] [address] Phone: [phone] Hours: First and Third Saturdays of each Month, 9 a.m. - 1 p.m.  [facility]: [address] [place], [place], [place] [address] Phone: [phone] Hours: Tuesday, 4 p.m. - 8 p.m. Thursday, 1 p.m. - 8 p.m. Wednesday, 9 a.m. - [facility] [address], [place], [place] [address] Phone: [phone] Pharmacy Phone Number: [phone] Dental Phone Number: [phone] [place] Help: [phone]  Dental Hours: Monday - Thursday, 8 a.m. - 6 p.m.  [facility] [address] [place], [place] [address] Phone: [phone] Pharmacy Phone Number: [phone] [facility] Insurance Help:  [phone]  [facility] [address] [place], [place] [address] Phone: [phone] Pharmacy Phone Number: [phone] [facility] Insurance Help: [phone]  [facility] [address] [place], [place] [address] Phone: [phone] [facility] Insurance Help: [phone]   [address] [place],  [address] Phone: [phone]  Go to www.goodrx.com to look up your medications. This will give you a list of where you can find your prescriptions at the most affordable prices. Or ask the pharmacist what the cash price is, or if they have any other discount programs available to help make your medication more affordable. This can be less expensive than what you would pay with insurance.

## 2018-03-02 NOTE — ED Notes (Signed)
Patient shows no signs of adverse reaction to medication at this time.  

## 2018-03-05 ENCOUNTER — Other Ambulatory Visit: Payer: Self-pay

## 2018-03-05 ENCOUNTER — Ambulatory Visit
Admission: EM | Admit: 2018-03-05 | Discharge: 2018-03-05 | Disposition: A | Discharge status: Home / Self Care | Payer: BLUE CROSS/BLUE SHIELD | Attending: Family Medicine | Admitting: Family Medicine

## 2018-03-05 ENCOUNTER — Encounter: Payer: Self-pay | Admitting: Emergency Medicine

## 2018-03-05 DIAGNOSIS — J069 Acute upper respiratory infection, unspecified: Secondary | ICD-10-CM

## 2018-03-05 DIAGNOSIS — B9789 Other viral agents as the cause of diseases classified elsewhere: Secondary | ICD-10-CM

## 2018-03-05 DIAGNOSIS — Z87891 Personal history of nicotine dependence: Secondary | ICD-10-CM

## 2018-03-05 LAB — CULTURE, GROUP A STREP (THRC)

## 2018-03-05 MED ORDER — PREDNISONE 50 MG PO TABS: ORAL_TABLET | ORAL | 0 refills | Status: DC

## 2018-03-05 MED ORDER — HYDROCOD POLST-CPM POLST ER 10-8 MG/5ML PO SUER: 5.0000 mL | Freq: Every evening | ORAL | 0 refills | Status: DC | PRN

## 2018-03-05 MED ORDER — BENZONATATE 100 MG PO CAPS: 100.0000 mg | ORAL_CAPSULE | Freq: Three times a day (TID) | ORAL | 0 refills | Status: DC | PRN

## 2018-03-05 NOTE — ED Triage Notes (Signed)
Patient in today c/o productive cough and sob x 2 days. Patient denies fever. Patient was treated for strep on 03/02/17. Patient has tried OTC Mucinex.

## 2018-03-05 NOTE — ED Provider Notes (Signed)
[facility]    CSN: [id number] Arrival date & time: 03/05/18  6546  History   Chief Complaint Chief Complaint  Patient presents with  . Cough    APPT  . Shortness of Breath   HPI  38 year old female presents with cough.  Patient reports that she has had ongoing cough.  And as of Sunday.  She has been using Mucinex without improvement.  She reports associated shortness of breath.  No documented fever.  Patient recently seen on 2/15 and was treated empirically for strep.  Culture has returned negative.  Cough is severe.  Worse at night.  No known relieving factors.  Patient has hoarseness as well.  No other complaints.  PMH, Surgical Hx, Family Hx, Social History reviewed and updated as below.  Past Medical History:  Diagnosis Date  . Headache    high pressure headaches but does not take anything anymore  . Heart murmur    not being followed  . Mitral valve prolapse   . Tricuspid valve prolapse    Past Surgical History:  Procedure Laterality Date  . ABDOMINAL HYSTERECTOMY    . DILATION AND CURETTAGE OF UTERUS  2001  . FRACTURE SURGERY Bilateral 1999   screws and plates in both feet  . LAPAROSCOPIC BILATERAL SALPINGECTOMY Bilateral 01/11/2016   Procedure: LAPAROSCOPIC BILATERAL SALPINGECTOMY;  Surgeon: Honor Loh Ward, MD;  Location: [facility];  Service: Gynecology;  Laterality: Bilateral;  . LAPAROSCOPIC HYSTERECTOMY N/A 01/11/2016   Procedure: HYSTERECTOMY TOTAL LAPAROSCOPIC;  Surgeon: Honor Loh Ward, MD;  Location: [facility] OR[facility];  Service: Gynecology;  Laterality: N/A;  . OVARIAN CYST REMOVAL  1986  . WISDOM TOOTH EXTRACTION  2002   OB History   No obstetric history on file.    Home Medications    Prior to Admission medications   Medication Sig Start Date End Date Taking? Authorizing Provider  cetirizine (ZYRTEC) 10 MG tablet Take 10 mg by mouth daily.   Yes [provider]  fluticasone (FLONASE) 50 MCG/ACT nasal spray Place 1 spray into both  nostrils daily as needed for allergies. 12/31/15  Yes [provider]  ibuprofen (ADVIL,MOTRIN) 600 MG tablet Take 1 tablet (600 mg total) by mouth every 6 (six) hours as needed. 03/02/18  Yes Melynda Ripple, MD  Multiple Vitamin (MULTIVITAMIN WITH MINERALS) TABS tablet Take 1 tablet by mouth daily.   Yes [provider]  benzonatate (TESSALON) 100 MG capsule Take 1 capsule (100 mg total) by mouth 3 (three) times daily as needed. 03/05/18   Coral Spikes, DO  chlorpheniramine-HYDROcodone (TUSSIONEX PENNKINETIC ER) 10-8 MG/5ML SUER Take 5 mLs by mouth at bedtime as needed. 03/05/18   Coral Spikes, DO  predniSONE (DELTASONE) 50 MG tablet 1 tablet daily x 5 days 03/05/18   Coral Spikes, DO    Family History Family History  Problem Relation Age of Onset  . Diabetic kidney disease Father   . Hypertension Father   . Seizures Son   . Healthy Mother     Social History Social History   Tobacco Use  . Smoking status: Former Smoker    Last attempt to quit: 02/15/2001    Years since quitting: 17.0  . Smokeless tobacco: Never Used  Substance Use Topics  . Alcohol use: Yes    Comment: socially  . Drug use: No     Allergies   Sulfa antibiotics; Bupropion; Latex; and Omnicef [cefdinir]   Review of Systems Review of Systems  Constitutional: Negative for fever.  HENT: Positive for sore throat and voice change.   Respiratory: Positive for cough.    Physical Exam Triage Vital Signs ED Triage Vitals  Enc Vitals Group     BP 03/05/18 0925 130/81     Pulse Rate 03/05/18 0925 76     Resp 03/05/18 0925 16     Temp 03/05/18 0925 98.5 F (36.9 C)     Temp Source 03/05/18 0925 Oral     SpO2 03/05/18 0925 100 %     Weight 03/05/18 0926 124 lb (56.2 kg)     Height 03/05/18 0926 5\' 4"  (1.626 m)     Head Circumference --      Peak Flow --      Pain Score 03/05/18 0925 4     Pain Loc --      Pain Edu? --      Excl. in [place]? --    Updated Vital Signs BP 130/81 (BP  Location: Left Arm)   Pulse 76   Temp 98.5 F (36.9 C) (Oral)   Resp 16   Ht 5\' 4"  (1.626 m)   Wt 56.2 kg   LMP 01/01/2016 (Exact Date)   SpO2 100%   BMI 21.28 kg/m   Visual Acuity Right Eye Distance:   Left Eye Distance:   Bilateral Distance:    Right Eye Near:   Left Eye Near:    Bilateral Near:     Physical Exam Vitals signs and nursing note reviewed.  Constitutional:      General: She is not in acute distress.    Appearance: She is well-developed.  HENT:     Head: Normocephalic and atraumatic.     Right Ear: Tympanic membrane normal.     Left Ear: Tympanic membrane normal.     Mouth/Throat:     Comments: Oropharynx with mild erythema. Eyes:     General:        Right eye: No discharge.        Left eye: No discharge.     Conjunctiva/sclera: Conjunctivae normal.  Cardiovascular:     Rate and Rhythm: Normal rate and regular rhythm.  Pulmonary:     Effort: Pulmonary effort is normal.     Breath sounds: Normal breath sounds. No wheezing, rhonchi or rales.  Neurological:     Mental Status: She is alert.  Psychiatric:        Mood and Affect: Mood normal.        Behavior: Behavior normal.    UC Treatments / Results  Labs (all labs ordered are listed, but only abnormal results are displayed) Labs Reviewed - No data to display  EKG None  Radiology No results found.  Procedures Procedures (including critical care time)  Medications Ordered in UC Medications - No data to display  Initial Impression / Assessment and Plan / UC Course  I have reviewed the triage vital signs and the nursing notes.  Pertinent labs & imaging results that were available during my care of the patient were reviewed by me and considered in my medical decision making (see chart for details).    39 year old female presents with a viral respiratory infection w/ cough. Treating with tessalon perles, tussionex, and prednisone.   Final Clinical Impressions(s) / UC Diagnoses   Final  diagnoses:  Viral URI with cough     Discharge Instructions     Medications as prescribed.  Take care  Dr. Lacinda Axon    ED Prescriptions    Medication Sig  Dispense Auth. Provider   benzonatate (TESSALON) 100 MG capsule Take 1 capsule (100 mg total) by mouth 3 (three) times daily as needed. 30 capsule Parker, [place] G, DO   chlorpheniramine-HYDROcodone (TUSSIONEX PENNKINETIC ER) 10-8 MG/5ML SUER Take 5 mLs by mouth at bedtime as needed. 60 mL Allysia Ingles G, DO   predniSONE (DELTASONE) 50 MG tablet 1 tablet daily x 5 days 5 tablet Coral Spikes, DO     Controlled Substance Prescriptions [place] Controlled Substance Registry consulted? Not Applicable   Coral Spikes, DO 03/05/18 1003

## 2018-03-05 NOTE — Discharge Instructions (Signed)
Medications as prescribed. ° °Take care ° °Dr. Rosemary Mossbarger  °

## 2018-06-25 ENCOUNTER — Other Ambulatory Visit: Payer: BLUE CROSS/BLUE SHIELD

## 2018-06-25 ENCOUNTER — Ambulatory Visit: Payer: Self-pay | Admitting: *Deleted

## 2018-06-25 ENCOUNTER — Telehealth: Payer: Self-pay | Admitting: *Deleted

## 2018-06-25 DIAGNOSIS — Z20822 Contact with and (suspected) exposure to covid-19: Secondary | ICD-10-CM

## 2018-06-25 LAB — NOVEL CORONAVIRUS, NAA: SARS-CoV-2, NAA: NOT DETECTED

## 2018-06-25 NOTE — Telephone Encounter (Signed)
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  I called pt and scheduled her for today at the [facility] location in [place] for [place] testing.   I instructed her to stay in the car that it's  A drive thru.   Wear a mask.   Her results would be back in 24-72 hrs.    Insur:  [employer] entered

## 2018-06-25 NOTE — Telephone Encounter (Signed)
Opened chart twice by mistake.   Disregard. 

## 2018-06-28 ENCOUNTER — Telehealth: Payer: Self-pay | Admitting: *Deleted

## 2018-06-28 NOTE — Telephone Encounter (Addendum)
Patient called and spoke with Melissa, [employer] agent regarding COVID test results.   Returned call to pt and explained to pt that results have not been received for testing at this time. Pt advised that when results are received she would be called and notified. Pt verbalized understanding.

## 2018-07-01 ENCOUNTER — Ambulatory Visit: Payer: Self-pay | Admitting: *Deleted

## 2018-07-01 NOTE — Telephone Encounter (Signed)
   Reason for Disposition . [1] Follow-up call to recent contact AND [2] information only call, no triage required    COVID-19 test results not available even the test was done last week.  Answer Assessment - Initial Assessment Questions 1. REASON FOR CALL: "What is the main reason for your call?     She had a COVID-19 test done at the [facility] in [place] last week.   She still does not have her results.   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  She can't return to work until she has proof she is negative.   She is out of town on vacation right now.    The dentist said they are losing money because clients are canceling their appointments as a result. I checked in the system and found the order.   I do not see any results even on the [facility] requisition that I could pull up.    The test was referred through the [facility]    After I could not find her results anywhere I gave her the number for the [facility].   She's going to call them to see if they have the results.  She is out of town on vacation and her 14 days will be up Wednesday if she was going to develop symptoms.   "I don't have any symptoms".    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  2. SYMPTOMS: "Does your child have any symptoms?"      *No Answer* 3. OTHER QUESTIONS: "Do you have any other questions?"     *No Answer*  - Author's note: IAQ's are intended for training purposes and not meant to be required on every  call.  Protocols used: INFORMATION ONLY CALL - NO TRIAGE-P-AH

## 2018-07-02 ENCOUNTER — Encounter: Payer: Self-pay | Admitting: Hematology

## 2018-08-07 LAB — NOVEL CORONAVIRUS, NAA: SARS-CoV-2, NAA: NOT DETECTED

## 2019-01-24 ENCOUNTER — Ambulatory Visit
Admission: EM | Admit: 2019-01-24 | Discharge: 2019-01-24 | Disposition: A | Discharge status: Home / Self Care | Payer: Self-pay | Attending: Family Medicine | Admitting: Family Medicine

## 2019-01-24 ENCOUNTER — Encounter: Payer: Self-pay | Admitting: Emergency Medicine

## 2019-01-24 ENCOUNTER — Other Ambulatory Visit: Payer: Self-pay

## 2019-01-24 DIAGNOSIS — R1011 Right upper quadrant pain: Secondary | ICD-10-CM | POA: Insufficient documentation

## 2019-01-24 LAB — BASIC METABOLIC PANEL
Anion gap: 8 (ref 5–15)
BUN: 13 mg/dL (ref 6–20)
CO2: 25 mmol/L (ref 22–32)
Calcium: 9 mg/dL (ref 8.9–10.3)
Chloride: 104 mmol/L (ref 98–111)
Creatinine, Ser: 0.53 mg/dL (ref 0.44–1.00)
GFR calc Af Amer: >60 mL/min (ref >60)
GFR calc non Af Amer: >60 mL/min (ref >60)
Glucose, Bld: 93 mg/dL (ref 70–99)
Potassium: 4.3 mmol/L (ref 3.5–5.1)
Sodium: 137 mmol/L (ref 135–145)

## 2019-01-24 LAB — CBC WITH DIFFERENTIAL/PLATELET
Abs Immature Granulocytes: 0.02 10*3/uL (ref 0.00–0.07)
Basophils Absolute: 0 10*3/uL (ref 0.0–0.1)
Basophils Relative: 1 %
Eosinophils Absolute: 0.1 10*3/uL (ref 0.0–0.5)
Eosinophils Relative: 3 %
HCT: 39.3 % (ref 36.0–46.0)
Hemoglobin: 13.4 g/dL (ref 12.0–15.0)
Immature Granulocytes: 0 %
Lymphocytes Relative: 23 %
Lymphs Abs: 1.3 10*3/uL (ref 0.7–4.0)
MCH: 32 pg (ref 26.0–34.0)
MCHC: 34.1 g/dL (ref 30.0–36.0)
MCV: 93.8 fL (ref 80.0–100.0)
Monocytes Absolute: 0.4 10*3/uL (ref 0.1–1.0)
Monocytes Relative: 7 %
Neutro Abs: 3.8 10*3/uL (ref 1.7–7.7)
Neutrophils Relative %: 66 %
Platelets: 213 10*3/uL (ref 150–400)
RBC: 4.19 MIL/uL (ref 3.87–5.11)
RDW: 13.2 % (ref 11.5–15.5)
WBC: 5.7 10*3/uL (ref 4.0–10.5)
nRBC: 0 % (ref 0.0–0.2)

## 2019-01-24 LAB — LIPASE, BLOOD: Lipase: 36 U/L (ref 11–51)

## 2019-01-24 NOTE — ED Provider Notes (Signed)
[facility] ____________________________________________  Time seen: Approximately 5:00 PM  I have reviewed the triage vital signs and the nursing notes.   HISTORY  Chief Complaint Abdominal Pain (RUQ)   HPI Janice Vargas is a 39 y.o. female present for evaluation of right upper quadrant abdominal pain.  Patient reports for the last few years she has had occasional pain to right upper quadrant after eating.  Reports for the last 2 to 3 weeks she has been having more frequent right upper quadrant pain after she eats.  States overall unless she is eating she feels fine.  Has had occasional diarrhea.  States she had diarrhea lasting for a few days around Christmas time that then resolved and did have some diarrhea again today.  States today and her second bowel movement she had a few areas of black in her stool, no gross blood, denies known bleeding.  Occasional nausea with the pain.  No vomiting.  States pain is minimal right upper abdominal right now.  Denies fevers, dysuria, hematuria, flank pain, injury.  Denies recent cough, fevers or sickness.  Denies aggravating or alleviating factors otherwise.  Does report a few years ago she was seen for similar at that point it was recommended to either have cholecystectomy or HIDA scan and she chose to do HIDA scan however her insurance did not cover and she felt better so no more follow-up.  No PCP at this time.     Past Medical History:  Diagnosis Date  . Headache    high pressure headaches but does not take anything anymore  . Heart murmur    not being followed  . Mitral valve prolapse   . Tricuspid valve prolapse     There are no problems to display for this patient.   Past Surgical History:  Procedure Laterality Date  . ABDOMINAL HYSTERECTOMY    . DILATION AND CURETTAGE OF UTERUS  2001  . FRACTURE SURGERY Bilateral 1999   screws and plates in both feet  . LAPAROSCOPIC BILATERAL SALPINGECTOMY Bilateral 01/11/2016   Procedure: LAPAROSCOPIC BILATERAL SALPINGECTOMY;  Surgeon: Honor Loh Ward, MD;  Location: [facility];  Service: Gynecology;  Laterality: Bilateral;  . LAPAROSCOPIC HYSTERECTOMY N/A 01/11/2016   Procedure: HYSTERECTOMY TOTAL LAPAROSCOPIC;  Surgeon: Honor Loh Ward, MD;  Location: [facility] OR[facility];  Service: Gynecology;  Laterality: N/A;  . OVARIAN CYST REMOVAL  1986  . WISDOM TOOTH EXTRACTION  2002     No current facility-administered medications for this encounter.  Current Outpatient Medications:  .  cetirizine (ZYRTEC) 10 MG tablet, Take 10 mg by mouth daily., Disp: , Rfl:  .  Multiple Vitamin (MULTIVITAMIN WITH MINERALS) TABS tablet, Take 1 tablet by mouth daily., Disp: , Rfl:  .  fluticasone (FLONASE) 50 MCG/ACT nasal spray, Place 1 spray into both nostrils daily as needed for allergies., Disp: , Rfl: 0 .  ibuprofen (ADVIL,MOTRIN) 600 MG tablet, Take 1 tablet (600 mg total) by mouth every 6 (six) hours as needed., Disp: 30 tablet, Rfl: 0  Allergies Sulfa antibiotics, Bupropion, Latex, and Omnicef [cefdinir]  Family History  Problem Relation Age of Onset  . Diabetic kidney disease Father   . Hypertension Father   . Seizures Son   . Healthy Mother     Social History Social History   Tobacco Use  . Smoking status: Former Smoker    Quit date: 02/15/2001    Years since quitting: 17.9  . Smokeless tobacco: Never Used  Substance Use Topics  . Alcohol use:  Yes    Comment: socially  . Drug use: No    Review of Systems Constitutional: No fever ENT: No sore throat. Cardiovascular: Denies chest pain. Respiratory: Denies shortness of breath. Gastrointestinal: As above.  Genitourinary: Negative for dysuria. Musculoskeletal: Negative for back pain. Skin: Negative for rash.   ____________________________________________   PHYSICAL EXAM:  VITAL SIGNS: ED Triage Vitals [01/24/19 1508]  Enc Vitals Group     BP 120/78     Pulse Rate 67     Resp 14     Temp 98.3 F (36.8 C)      Temp Source Oral     SpO2 100 %     Weight 130 lb (59 kg)     Height 5\' 4"  (1.626 m)     Head Circumference      Peak Flow      Pain Score 2     Pain Loc      Pain Edu?      Excl. in [place]?     Constitutional: Alert and oriented. Well appearing and in no acute distress. Eyes: Conjunctivae are normal.  ENT      Head: Normocephalic and atraumatic. Cardiovascular: Normal rate, regular rhythm. Grossly normal heart sounds.  Good peripheral circulation. Respiratory: Normal respiratory effort without tachypnea nor retractions. Breath sounds are clear and equal bilaterally. No wheezes, rales, rhonchi. Gastrointestinal: No distention. Normal Bowel sounds.  Mild right upper quadrant tenderness palpation.  Abdomen otherwise soft and nontender.  No CVA tenderness. Musculoskeletal: Steady gait.  Neurologic:  Normal speech and language. Speech is normal. No gait instability.  Skin:  Skin is warm, dry and intact. No rash noted. Psychiatric: Mood and affect are normal. Speech and behavior are normal. Patient exhibits appropriate insight and judgment   ___________________________________________   LABS (all labs ordered are listed, but only abnormal results are displayed)  Labs Reviewed  CBC WITH DIFFERENTIAL/PLATELET  BASIC METABOLIC PANEL  LIPASE, BLOOD    RADIOLOGY  No results found. ____________________________________________   PROCEDURES Procedures    INITIAL IMPRESSION / ASSESSMENT AND PLAN / ED COURSE  Pertinent labs & imaging results that were available during my care of the patient were reviewed by me and considered in my medical decision making (see chart for details).  Well-appearing patient.  No acute distress.  Suspect gall colic.  Patient also reported episode of black spots in her stool today, offered rectal exam at this time for Hemoccult.  Patient declined.  Will order for GI panel and Hemoccult for patient to return sample, patient agreed to this.  Laboratory studies  a CBC, BMP and lipase were evaluated and were unremarkable.  Patient well-appearing at this time.  No ultrasound available at this facility at this time.  Recommend for outpatient follow-up with GI, referral placed.  Encouraged gall diet and close monitoring.  Discussed very strict follow-up and return parameters.  Patient agreed.  Discussed follow up and return parameters including no resolution or any worsening concerns. Patient verbalized understanding and agreed to plan.   ____________________________________________   FINAL CLINICAL IMPRESSION(S) / ED DIAGNOSES  Final diagnoses:  Right upper quadrant abdominal pain     ED Discharge Orders         Ordered    Ambulatory referral to Gastroenterology    Comments: RUQ pain worsening over last few weeks, concern for cholelithiasis   01/24/19 1639           Note: This dictation was prepared with Dragon dictation along with smaller phrase  technology. Any transcriptional errors that result from this process are unintentional.         Marylene Land, NP 01/24/19 1728

## 2019-01-24 NOTE — Discharge Instructions (Signed)
Follow gallbladder eating plan to assist with this.  Please return stool sample test as soon as possible as discussed.  Call to follow-up with gastroenterology.  This is important.  Return to urgent care as needed.  Proceed directly to emergency room for increased pain or worsening complaints.

## 2019-01-24 NOTE — ED Triage Notes (Signed)
Patient c/o RUQ abdominal pain off and on couple of weeks.  Patient denies N/V.  Patient reports some diarrhea.

## 2019-01-27 ENCOUNTER — Other Ambulatory Visit
Admission: RE | Admit: 2019-01-27 | Discharge: 2019-01-27 | Disposition: A | Discharge status: Home / Self Care | Payer: BC Managed Care – PPO | Source: Ambulatory Visit | Attending: Emergency Medicine | Admitting: Emergency Medicine

## 2019-01-27 DIAGNOSIS — R197 Diarrhea, unspecified: Secondary | ICD-10-CM | POA: Insufficient documentation

## 2019-01-27 LAB — OCCULT BLOOD X 1 CARD TO LAB, STOOL: Fecal Occult Bld: NEGATIVE

## 2019-01-28 LAB — GI PATHOGEN PANEL BY PCR, STOOL
Adenovirus F 40/41: NOT DETECTED
Astrovirus: NOT DETECTED
Campylobacter by PCR: NOT DETECTED
Cryptosporidium by PCR: NOT DETECTED
Cyclospora cayetanensis: NOT DETECTED
E coli (ETEC) LT/ST: NOT DETECTED
E coli (STEC): NOT DETECTED
Entamoeba histolytica: NOT DETECTED
Enteroaggregative E coli: NOT DETECTED
Enteropathogenic E coli: NOT DETECTED
G lamblia by PCR: NOT DETECTED
Norovirus GI/GII: NOT DETECTED
Plesiomonas shigelloides: NOT DETECTED
Rotavirus A by PCR: NOT DETECTED
Salmonella by PCR: DETECTED — AB
Sapovirus: NOT DETECTED
Shigella by PCR: NOT DETECTED
Vibrio cholerae: NOT DETECTED
Vibrio: NOT DETECTED
Yersinia enterocolitica: NOT DETECTED

## 2019-01-29 ENCOUNTER — Encounter (HOSPITAL_COMMUNITY): Payer: Self-pay

## 2019-01-29 ENCOUNTER — Telehealth (HOSPITAL_COMMUNITY): Payer: Self-pay | Admitting: Emergency Medicine

## 2019-01-29 NOTE — Telephone Encounter (Signed)
Discussed with patient regarding symptoms. Pt states she is feeling better, has not had any diarrhea the last three days, just mild nausea with eating. Reviewed with Dr. Mannie Stabile and patient since pt is no longer having diarrhea, at this time pt will follow up with GI. She is going to call today and set up appt. Pt agreeable to no treatment since she is feeling better. Verbalized understanding, all questions answered.

## 2019-02-20 ENCOUNTER — Encounter: Payer: Self-pay | Admitting: Gastroenterology

## 2019-02-20 ENCOUNTER — Ambulatory Visit (INDEPENDENT_AMBULATORY_CARE_PROVIDER_SITE_OTHER): Payer: BC Managed Care – PPO | Admitting: Gastroenterology

## 2019-02-20 ENCOUNTER — Other Ambulatory Visit: Payer: Self-pay

## 2019-02-20 VITALS — BP 124/71 | HR 82 | Temp 97.8°F | Ht 64.0 in | Wt 132.6 lb

## 2019-02-20 DIAGNOSIS — R1011 Right upper quadrant pain: Secondary | ICD-10-CM | POA: Diagnosis not present

## 2019-02-20 DIAGNOSIS — R112 Nausea with vomiting, unspecified: Secondary | ICD-10-CM

## 2019-02-20 DIAGNOSIS — R011 Cardiac murmur, unspecified: Secondary | ICD-10-CM | POA: Insufficient documentation

## 2019-02-20 DIAGNOSIS — D251 Intramural leiomyoma of uterus: Secondary | ICD-10-CM | POA: Insufficient documentation

## 2019-02-20 NOTE — Progress Notes (Signed)
Gastroenterology Consultation  Referring Provider:     Marylene Land, NP Primary Care Physician:  Patient, No Pcp Per Primary Gastroenterologist:  Dr. Allen Norris     Reason for Consultation:     Abdominal pain        HPI:   Janice Vargas is a 39 y.o. y/o female referred for consultation & management of abdominal pain by Dr. Patient, No Pcp Per.  This patient comes to see me after having been in the ER on January 8 of this year for abdominal pain.  At that time she had reported the pain to be in the right upper quadrant and usually after eating.  She had reported that it was going on for years but last 2 to 3 weeks prior to going to the ER more frequent pain after she eats.  She endorsed no pain without eating.  There is also a report of diarrhea that on a follow-up phone call on January 13th had been documented that it resolved. The patient did have a GI panel sent off on January 11 of this year and it came back positive for Salmonella. The patient now reports that her diarrhea is still not returned.  She also reports a strong family history of the women in her family having their gallbladder out.  The patient does report that she had seen a surgeon in the past for the same issues and was told at that time that her insurance would not cover a HIDA scan.  There is no report of any black stools or bloody stools.  She also denies any fevers or chills.  Past Medical History:  Diagnosis Date  . Headache    high pressure headaches but does not take anything anymore  . Heart murmur    not being followed  . Mitral valve prolapse   . Tricuspid valve prolapse     Past Surgical History:  Procedure Laterality Date  . ABDOMINAL HYSTERECTOMY    . DILATION AND CURETTAGE OF UTERUS  2001  . FRACTURE SURGERY Bilateral 1999   screws and plates in both feet  . LAPAROSCOPIC BILATERAL SALPINGECTOMY Bilateral 01/11/2016   Procedure: LAPAROSCOPIC BILATERAL SALPINGECTOMY;  Surgeon: Honor Loh Ward, MD;   Location: [facility];  Service: Gynecology;  Laterality: Bilateral;  . LAPAROSCOPIC HYSTERECTOMY N/A 01/11/2016   Procedure: HYSTERECTOMY TOTAL LAPAROSCOPIC;  Surgeon: Honor Loh Ward, MD;  Location: [facility] OR[facility];  Service: Gynecology;  Laterality: N/A;  . OVARIAN CYST REMOVAL  1986  . WISDOM TOOTH EXTRACTION  2002    Prior to Admission medications   Medication Sig Start Date End Date Taking? Authorizing Provider  cetirizine (ZYRTEC) 10 MG tablet Take 10 mg by mouth daily.    [provider]  ergocalciferol (VITAMIN D2) 1.25 MG (50000 UT) capsule Take by mouth. 12/20/16   [provider]  fluticasone (FLONASE) 50 MCG/ACT nasal spray Place 1 spray into both nostrils daily as needed for allergies. 12/31/15   [provider]  ibuprofen (ADVIL,MOTRIN) 600 MG tablet Take 1 tablet (600 mg total) by mouth every 6 (six) hours as needed. 03/02/18   Melynda Ripple, MD  meloxicam (MOBIC) 15 MG tablet Mobic 15 mg tablet  Take 1 tablet every day by oral route.    [provider]  Multiple Vitamin (MULTIVITAMIN WITH MINERALS) TABS tablet Take 1 tablet by mouth daily.    [provider]  omeprazole (PRILOSEC OTC) 20 MG tablet Take by mouth. 01/02/17   [provider]  Family History  Problem Relation Age of Onset  . Diabetic kidney disease Father   . Hypertension Father   . Seizures Son   . Healthy Mother      Social History   Tobacco Use  . Smoking status: Former Smoker    Quit date: 02/15/2001    Years since quitting: 18.0  . Smokeless tobacco: Never Used  Substance Use Topics  . Alcohol use: Yes    Comment: socially  . Drug use: No    Allergies as of 02/20/2019 - Review Complete 01/24/2019  Allergen Reaction Noted  . Sulfa antibiotics Hives and Shortness Of Breath 01/06/2016  . Bupropion Hives 01/06/2016  . Latex Rash 01/06/2016  . Omnicef [cefdinir] Hives 01/06/2016    Review of Systems:    All systems reviewed and negative except  where noted in HPI.   Physical Exam:  LMP 01/01/2016 (Exact Date)  Patient's last menstrual period was 01/01/2016 (exact date). General:   Alert,  Well-developed, well-nourished, pleasant and cooperative in NAD Head:  Normocephalic and atraumatic. Eyes:  Sclera clear, no icterus.   Conjunctiva pink. Ears:  Normal auditory acuity. Neck:  Supple; no masses or thyromegaly. Lungs:  Respirations even and unlabored.  Clear throughout to auscultation.   No wheezes, crackles, or rhonchi. No acute distress. Heart:  Regular rate and rhythm; no murmurs, clicks, rubs, or gallops. Abdomen:  Normal bowel sounds.  No bruits.  Soft, mildly tender and non-distended without masses, hepatosplenomegaly or hernias noted.  No guarding or rebound tenderness.  Negative Carnett sign.   Rectal:  Deferred.  Pulses:  Normal pulses noted. Extremities:  No clubbing or edema.  No cyanosis. Neurologic:  Alert and oriented x3;  grossly normal neurologically. Skin:  Intact without significant lesions or rashes.  No jaundice. Lymph Nodes:  No significant cervical adenopathy. Psych:  Alert and cooperative. Normal mood and affect.  Imaging Studies: No results found.  Assessment and Plan:   [name] is a 39 y.o. y/o female who comes in today with a history of right upper quadrant issues presumed to be gallbladder in nature.  The patient will be set up for a right upper quadrant ultrasound with a HIDA scan.  The patient has also been told that since her diarrhea has resolved she will not need any further work-up for that.  The patient's Salmonella will also not be treated due to the risk of prolonging the carrier state.  The patient denies any family with Salmonella or similar symptoms.  The patient has been explained the plan and agrees with it.    Lucilla Lame, MD. [facility]    Note: This dictation was prepared with Dragon dictation along with smaller phrase technology. Any transcriptional errors that result from this  process are unintentional.

## 2019-03-14 ENCOUNTER — Encounter
Admission: RE | Admit: 2019-03-14 | Discharge: 2019-03-14 | Disposition: A | Discharge status: Home / Self Care | Payer: BC Managed Care – PPO | Source: Ambulatory Visit | Attending: Gastroenterology | Admitting: Gastroenterology

## 2019-03-14 ENCOUNTER — Ambulatory Visit
Admission: RE | Admit: 2019-03-14 | Discharge: 2019-03-14 | Disposition: A | Discharge status: Home / Self Care | Payer: BC Managed Care – PPO | Source: Ambulatory Visit | Attending: Gastroenterology | Admitting: Gastroenterology

## 2019-03-14 ENCOUNTER — Other Ambulatory Visit: Payer: Self-pay

## 2019-03-14 DIAGNOSIS — R112 Nausea with vomiting, unspecified: Secondary | ICD-10-CM | POA: Insufficient documentation

## 2019-03-14 DIAGNOSIS — R1011 Right upper quadrant pain: Secondary | ICD-10-CM | POA: Diagnosis present

## 2019-03-14 MED ORDER — TECHNETIUM TC 99M MEBROFENIN IV KIT
5.1300 | PACK | Freq: Once | INTRAVENOUS | Status: AC | PRN
  Administered 2019-03-14: 10:00:00 5.13 via INTRAVENOUS

## 2019-03-18 ENCOUNTER — Telehealth: Payer: Self-pay

## 2019-03-18 NOTE — Telephone Encounter (Signed)
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  The gallbladder is working well so her symptoms are not caused by her gallbladder.  If she is continuing to have problems with pain after eating she should be set up for an upper endoscopy.

## 2019-03-18 NOTE — Telephone Encounter (Signed)
Pt notified of results via mychart.  

## 2019-03-19 ENCOUNTER — Telehealth: Payer: Self-pay

## 2019-03-19 NOTE — Telephone Encounter (Signed)
-----   Message from Lucilla Lame, MD sent at 03/19/2019  5:58 AM EST ----- Please let the patient know that her gallbladder emptying study showed a normal functioning gallbladder.  If she is continuing to have nausea and vomiting please have her come and to talk about our next steps.

## 2019-03-19 NOTE — Telephone Encounter (Signed)
Pt has been notified of results via mychart.

## 2019-06-12 ENCOUNTER — Ambulatory Visit
Admission: EM | Admit: 2019-06-12 | Discharge: 2019-06-12 | Disposition: A | Discharge status: Home / Self Care | Payer: BC Managed Care – PPO | Attending: Urgent Care | Admitting: Urgent Care

## 2019-06-12 ENCOUNTER — Telehealth: Payer: Self-pay | Admitting: Gastroenterology

## 2019-06-12 ENCOUNTER — Other Ambulatory Visit: Payer: Self-pay

## 2019-06-12 DIAGNOSIS — R197 Diarrhea, unspecified: Secondary | ICD-10-CM

## 2019-06-12 DIAGNOSIS — R110 Nausea: Secondary | ICD-10-CM

## 2019-06-12 DIAGNOSIS — R1084 Generalized abdominal pain: Secondary | ICD-10-CM

## 2019-06-12 MED ORDER — DICYCLOMINE HCL 20 MG PO TABS: 20.0000 mg | ORAL_TABLET | Freq: Two times a day (BID) | ORAL | 0 refills | Status: AC

## 2019-06-12 NOTE — ED Triage Notes (Signed)
Patient complains of upper right abdominal pain that has been ongoing since having salmonella in January. Patient states that the pain came back last night intensely and she has noticed some orange stools since this morning. Patient states that she called her GI doctor and they cannot see her til 06/23/2019. Patient states that she feels nausea currently.

## 2019-06-12 NOTE — Telephone Encounter (Signed)
Left vm for pt to schedule a follow up appt during the week of June 7th. Pt's last office visit was in February for the same symptoms.

## 2019-06-12 NOTE — Discharge Instructions (Addendum)
It was very nice seeing you today in clinic. Thank you for entrusting me with your care.   Use Bentyl for abdominal pain. Continue Zofran. Collect stool for testing and return to lab here.   Follow up with Dr. Allen Norris as already scheduled. If your symptoms/condition worsens, please seek follow up care either here or in the ER. Please remember, our [place] providers are "right here with you" when you need [place].   Again, it was my pleasure to take care of you today. Thank you for choosing our clinic. I hope that you start to feel better quickly.   Honor Loh, MSN, APRN, FNP-C, CEN Advanced Practice Provider [facility]

## 2019-06-12 NOTE — Telephone Encounter (Signed)
Patient called and still having problems. Her bowel movements look orange, nauseous, abd pain right side under rib cage. Please call patient.

## 2019-06-13 ENCOUNTER — Other Ambulatory Visit
Admission: RE | Admit: 2019-06-13 | Discharge: 2019-06-13 | Disposition: A | Discharge status: Home / Self Care | Payer: BC Managed Care – PPO | Source: Ambulatory Visit | Attending: Urgent Care | Admitting: Urgent Care

## 2019-06-13 DIAGNOSIS — R197 Diarrhea, unspecified: Secondary | ICD-10-CM | POA: Diagnosis present

## 2019-06-13 LAB — GASTROINTESTINAL PANEL BY PCR, STOOL (REPLACES STOOL CULTURE)

## 2019-06-13 NOTE — ED Provider Notes (Signed)
[place], [place]   Name: Janice Vargas DOB: 28-Sep-1980 MRN: [id number] CSN: [id number] PCP: Patient, No Pcp Per  Arrival date and time:  06/12/19 1646  Chief Complaint:  Abdominal Pain  NOTE: Prior to seeing the patient today, I have reviewed the triage nursing documentation and vital signs. Clinical staff has updated patient's PMH/PSHx, current medication list, and drug allergies/intolerances to ensure comprehensive history available to assist in medical decision making.   History:   HPI: THADDEUS TESKA is a 39 y.o. female who presents today with complaints of generalized abdominal pain that been intermittent to varying degrees since January 2021. Patient seen here on 01/24/2019 by Sabra Heck, FNP-C; notes reviewed. At that time, patient having similar symptoms. Stool studies were performed and patient was diagnosed with salmonella. She was referred to gastroenterology Allen Norris, MD). Since her last visit, patient has undergone ultrasound imaging of her gallbladder and a HIDA scan, which were both essentially negative.  Patient reports that she began having significant pain in the RIGHT upper quadrant with associated nausea last night. This morning, patient having loose stools that she describes as "orange oil". She notes that her symptoms are similar to before when she had salmonella. Current pain in her abdomen described as "sharp spasms". Patient concerned because she ate shrimp last night for dinner, with the symptoms following soon after consumption. Patient advising that she cooked the shrimp herself, and therefore knows that they were cooked properly to a safe temperature. Patient called GI office however was unable to be seen today. She reports that she was offered an appointment on 06/23/19, which she accepted. Patient felt as if her symptoms warranted evaluation sooner than the offered GI appointment, thus prompting her visit to urgent care today.   Past Medical History:  Diagnosis Date  . Headache      high pressure headaches but does not take anything anymore  . Heart murmur    not being followed  . Mitral valve prolapse   . Tricuspid valve prolapse     Past Surgical History:  Procedure Laterality Date  . ABDOMINAL HYSTERECTOMY    . DILATION AND CURETTAGE OF UTERUS  2001  . FRACTURE SURGERY Bilateral 1999   screws and plates in both feet  . LAPAROSCOPIC BILATERAL SALPINGECTOMY Bilateral 01/11/2016   Procedure: LAPAROSCOPIC BILATERAL SALPINGECTOMY;  Surgeon: Honor Loh Ward, MD;  Location: [facility] ORS;  Service: Gynecology;  Laterality: Bilateral;  . LAPAROSCOPIC HYSTERECTOMY N/A 01/11/2016   Procedure: HYSTERECTOMY TOTAL LAPAROSCOPIC;  Surgeon: Honor Loh Ward, MD;  Location: [facility] ORS;  Service: Gynecology;  Laterality: N/A;  . OVARIAN CYST REMOVAL  1986  . WISDOM TOOTH EXTRACTION  2002    Family History  Problem Relation Age of Onset  . Diabetic kidney disease Father   . Hypertension Father   . Seizures Son   . Healthy Mother     Social History   Tobacco Use  . Smoking status: Former Smoker    Quit date: 02/15/2001    Years since quitting: 18.3  . Smokeless tobacco: Never Used  Substance Use Topics  . Alcohol use: Yes    Comment: socially  . Drug use: No    Patient Active Problem List   Diagnosis Date Noted  . Heart murmur 02/20/2019  . Intramural leiomyoma of uterus 02/20/2019  . Closed fracture of phalanx of foot 11/07/2016  . Chronic pain of right knee 11/05/2015    Home Medications:    Current Meds  Medication Sig  . cetirizine (ZYRTEC) 10  MG tablet Take 10 mg by mouth daily.  . fluticasone (FLONASE) 50 MCG/ACT nasal spray Place 1 spray into both nostrils daily as needed for allergies.  Marland Kitchen ibuprofen (ADVIL,MOTRIN) 600 MG tablet Take 1 tablet (600 mg total) by mouth every 6 (six) hours as needed.  . Multiple Vitamin (MULTIVITAMIN WITH MINERALS) TABS tablet Take 1 tablet by mouth daily.  Marland Kitchen omeprazole (PRILOSEC OTC) 20 MG tablet Take by mouth.     Allergies:   Sulfa antibiotics, Bupropion, Latex, and Omnicef [cefdinir]  Review of Systems (ROS):  Review of systems NEGATIVE unless otherwise noted in narrative H&P section.   Vital Signs: Today's Vitals   06/12/19 1715 06/12/19 1716 06/12/19 1743  BP:  98/67   Pulse:  (!) 53   Resp:  16   Temp:  98.2 F (36.8 C)   TempSrc:  Oral   SpO2:  99%   Weight: 127 lb (57.6 kg)    Height: 5\' 4"  (1.626 m)    PainSc: 5   5     Physical Exam: Physical Exam  Constitutional: She is oriented to person, place, and time and well-developed, well-nourished, and in no distress.  HENT:  Head: Normocephalic and atraumatic.  Mouth/Throat: Uvula is midline, oropharynx is clear and moist and mucous membranes are normal.  Eyes: Pupils are equal, round, and reactive to light.  Cardiovascular: Regular rhythm, normal heart sounds and intact distal pulses. Bradycardia present.  Pulmonary/Chest: Effort normal and breath sounds normal.  Abdominal: Soft. Normal appearance and bowel sounds are normal. She exhibits no distension. There is generalized abdominal tenderness (mostly overlying RUQ). There is no rebound, no guarding and negative Murphy's sign.  Neurological: She is alert and oriented to person, place, and time. Gait normal.  Skin: Skin is warm and dry. No rash noted. She is not diaphoretic.  Psychiatric: Memory, affect and judgment normal. Her mood appears anxious.  Nursing note and vitals reviewed.   Urgent Care Treatments / Results:   Orders Placed This Encounter  Procedures  . Gastrointestinal Panel by PCR , Stool    LABS: PLEASE NOTE: all labs that were ordered this encounter are listed, however only abnormal results are displayed. Labs Reviewed - No data to display  EKG: -None  RADIOLOGY: No results found.  PROCEDURES: Procedures  MEDICATIONS RECEIVED THIS VISIT: Medications - No data to display  PERTINENT CLINICAL COURSE NOTES/UPDATES:   Initial Impression /  Assessment and Plan / Urgent Care Course:  Pertinent labs & imaging results that were available during my care of the patient were personally reviewed by me and considered in my medical decision making (see lab/imaging section of note for values and interpretations).  DALECIA SALZWEDEL is a 39 y.o. female who presents to [facility] today with complaints of Abdominal Pain  Patient is well appearing overall in clinic today. She does not appear to be in any acute distress. Presenting symptoms (see HPI) and exam as documented above. Symptoms similar to previous salmonella infection. Will repeat GI panel to assess for diarrhea of infectious etiology. Patient unable to provide sample in clinic today; will return sample for testing. She has had recent imaging of her abdomen ([place] and HIDA) that were unremarkable. Deferring additional imaging today. Ultimately patient needs to follow up with GI for further evaluation and recommendations. She is scheduled to see Dr. Allen Norris on 06/23/19. In the interim, will trial dicyclomine for her abdominal pain. Patient has a retained supply of oral ondansetron for PRN use; encouraged to continue.  Patient to ensure adequate fluid intake to avoid dehydration and electrolyte derangements given her loose stools.   I have reviewed the follow up and strict return precautions for any new or worsening symptoms. Patient is aware of symptoms that would be deemed urgent/emergent, and would thus require further evaluation either here or in the emergency department. At the time of discharge, she verbalized understanding and consent with the discharge plan as it was reviewed with her. All questions were fielded by provider and/or clinic staff prior to patient discharge.    Final Clinical Impressions / Urgent Care Diagnoses:   Final diagnoses:  Generalized abdominal pain  Diarrhea of presumed infectious origin  Nausea    New Prescriptions:  [place] Controlled Substance Registry consulted? Not  Applicable  Meds ordered this encounter  Medications  . dicyclomine (BENTYL) 20 MG tablet    Sig: Take 1 tablet (20 mg total) by mouth 2 (two) times daily.    Dispense:  20 tablet    Refill:  0    Recommended Follow up Care:  Patient encouraged to follow up with the following provider within the specified time frame, or sooner as dictated by the severity of her symptoms. As always, she was instructed that for any urgent/emergent care needs, she should seek care either here or in the emergency department for more immediate evaluation.  Follow-up Information    Lucilla Lame, MD.   Specialty: Gastroenterology Why: As already scheduled for ongoing care and evaluation. Contact information: [address]  [place] [address] [id number]         NOTE: This note was prepared using Dragon dictation software along with smaller phrase technology. Despite my best ability to proofread, there is the potential that transcriptional errors may still occur from this process, and are completely unintentional.    Karen Kitchens, NP 06/13/19 2244

## 2019-06-24 ENCOUNTER — Other Ambulatory Visit: Payer: Self-pay

## 2019-06-24 ENCOUNTER — Ambulatory Visit (INDEPENDENT_AMBULATORY_CARE_PROVIDER_SITE_OTHER): Payer: BC Managed Care – PPO | Admitting: Gastroenterology

## 2019-06-24 ENCOUNTER — Encounter: Payer: Self-pay | Admitting: Gastroenterology

## 2019-06-24 VITALS — BP 111/73 | HR 75 | Ht 64.0 in | Wt 129.2 lb

## 2019-06-24 DIAGNOSIS — K580 Irritable bowel syndrome with diarrhea: Secondary | ICD-10-CM

## 2019-06-24 NOTE — Progress Notes (Signed)
Primary Care Physician: Patient, No Pcp Per  Primary Gastroenterologist:  Dr. Lucilla Lame  Chief Complaint  Patient presents with  . Abdominal Pain    HPI: Janice Vargas is a 39 y.o. female here for follow-up of abdominal pain and change in bowel habits.  The patient's right upper quadrant abdominal pain has been evaluated with a right upper quadrant ultrasound with a gallbladder emptying study that showed normal gallbladder function.  The patient's intermittent diarrhea has been described to come with orange oily droplets.  The patient denies any unexplained weight loss with her diarrhea.  The patient was in urgent care and had her stool sent off again for pathogens and everything was negative including Salmonella which she has had in the past.  The patient also reports that she had a viral infection in April.  Past Medical History:  Diagnosis Date  . Headache    high pressure headaches but does not take anything anymore  . Heart murmur    not being followed  . Mitral valve prolapse   . Tricuspid valve prolapse     Current Outpatient Medications  Medication Sig Dispense Refill  . cetirizine (ZYRTEC) 10 MG tablet Take 10 mg by mouth daily.    Marland Kitchen dicyclomine (BENTYL) 20 MG tablet Take 1 tablet (20 mg total) by mouth 2 (two) times daily. 20 tablet 0  . fluticasone (FLONASE) 50 MCG/ACT nasal spray Place 1 spray into both nostrils daily as needed for allergies.  0  . ibuprofen (ADVIL,MOTRIN) 600 MG tablet Take 1 tablet (600 mg total) by mouth every 6 (six) hours as needed. 30 tablet 0  . Multiple Vitamin (MULTIVITAMIN WITH MINERALS) TABS tablet Take 1 tablet by mouth daily.    Marland Kitchen omeprazole (PRILOSEC OTC) 20 MG tablet Take by mouth.    . EPINEPHrine 0.3 mg/0.3 mL IJ SOAJ injection INJECT AS DIRECTED ONLY DURING ANAPHYLACTIC REACTION.     No current facility-administered medications for this visit.    Allergies as of 06/24/2019 - Review Complete 06/24/2019  Allergen Reaction Noted    . Sulfa antibiotics Hives and Shortness Of Breath 01/06/2016  . Bupropion Hives 01/06/2016  . Latex Rash 01/06/2016  . Omnicef [cefdinir] Hives 01/06/2016    ROS:  General: Negative for anorexia, weight loss, fever, chills, fatigue, weakness. ENT: Negative for hoarseness, difficulty swallowing , nasal congestion. CV: Negative for chest pain, angina, palpitations, dyspnea on exertion, peripheral edema.  Respiratory: Negative for dyspnea at rest, dyspnea on exertion, cough, sputum, wheezing.  GI: See history of present illness. GU:  Negative for dysuria, hematuria, urinary incontinence, urinary frequency, nocturnal urination.  Endo: Negative for unusual weight change.    Physical Examination:   BP 111/73   Pulse 75   Ht 5\' 4"  (1.626 m)   Wt 129 lb 3.2 oz (58.6 kg)   LMP 01/01/2016 (Exact Date)   BMI 22.18 kg/m   General: Well-nourished, well-developed in no acute distress.  Eyes: No icterus. Conjunctivae pink. Lungs: Clear to auscultation bilaterally. Non-labored. Heart: Regular rate and rhythm, no murmurs rubs or gallops.  Abdomen: Bowel sounds are normal, positive tenderness to 1 finger palpation while flexing the abdominal wall muscles, nondistended, no hepatosplenomegaly or masses, no abdominal bruits or hernia , no rebound or guarding.   Extremities: No lower extremity edema. No clubbing or deformities. Neuro: Alert and oriented x 3.  Grossly intact. Skin: Warm and dry, no jaundice.   Psych: Alert and cooperative, normal mood and affect.  Labs:  Imaging Studies: No results found.  Assessment and Plan:   Janice Vargas is a 39 y.o. y/o female who comes in with abdominal pain that has been intermittent and is consistent with musculoskeletal pain and reproducible with 1 finger palpation of the abdominal wall muscles while flexing.  The patient will be started on a trial of pancreatic enzymes for possible exocrine pancreatic insufficiency due to her abdominal discomfort  loose bowel movements and oily stools.  She has been told that if this does not work we should try probiotics.  A small intestinal bacterial overgrowth test may be warranted if the patient's symptoms continue.  She will let me know if the pancreatic enzymes work and also if the probiotics work.  She will treat the abdominal wall pain/muscular pain with warm compresses as needed.  The patient does report that she also has chronic back pain which is seen very commonly in abdominal wall pain.  Since most of the symptoms started after a GI infection the possibility of postinfectious irritable bowel syndrome is high on the differential diagnosis.  The patient has been explained the plan and agrees with it.     Lucilla Lame, MD. Marval Regal    Note: This dictation was prepared with Dragon dictation along with smaller phrase technology. Any transcriptional errors that result from this process are unintentional.

## 2019-07-24 ENCOUNTER — Other Ambulatory Visit: Payer: Self-pay

## 2019-07-25 ENCOUNTER — Other Ambulatory Visit: Payer: Self-pay

## 2019-07-25 MED ORDER — ZENPEP 40000-126000 UNITS PO CPEP: ORAL_CAPSULE | ORAL | 5 refills | Status: AC

## 2020-09-28 IMAGING — US US ABDOMEN LIMITED
1 series · 14 of 25 positions shown · non-contrast
Comparison: CT abdomen and pelvis June 18, 2013.

CLINICAL DATA: Upper abdominal pain

EXAM:
ULTRASOUND ABDOMEN LIMITED RIGHT UPPER QUADRANT

[Series 1: us abdomen limited · 14 of 58 slices shown]
[im 1/58]
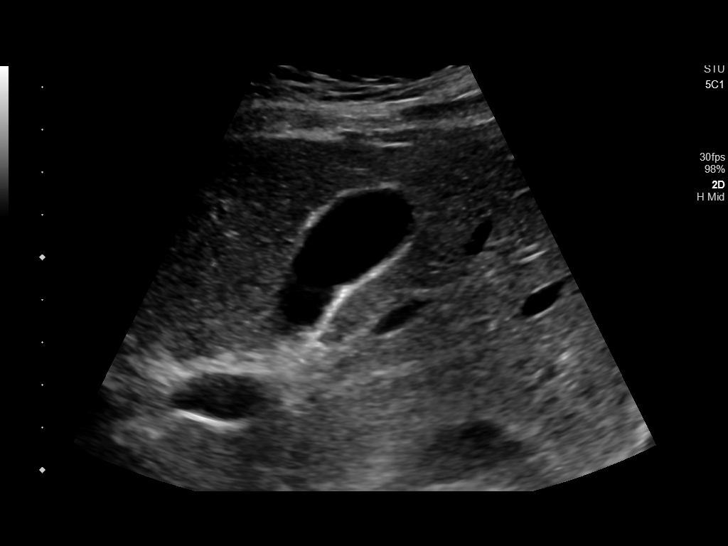
[im 5/58]
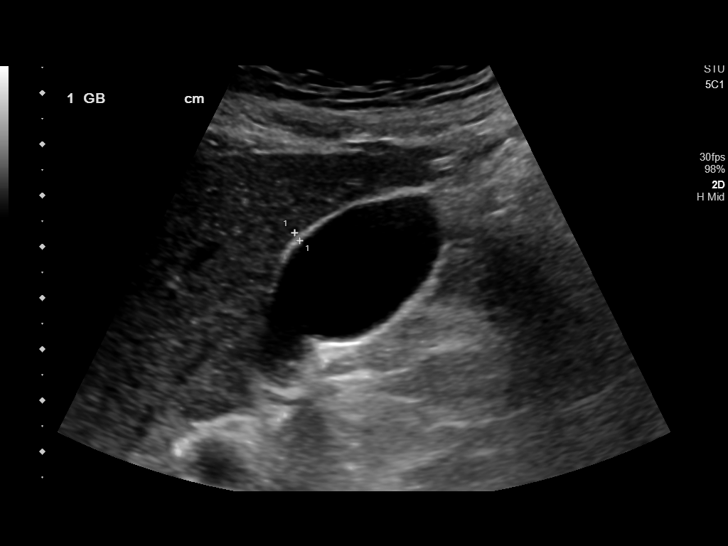
[im 10/58]
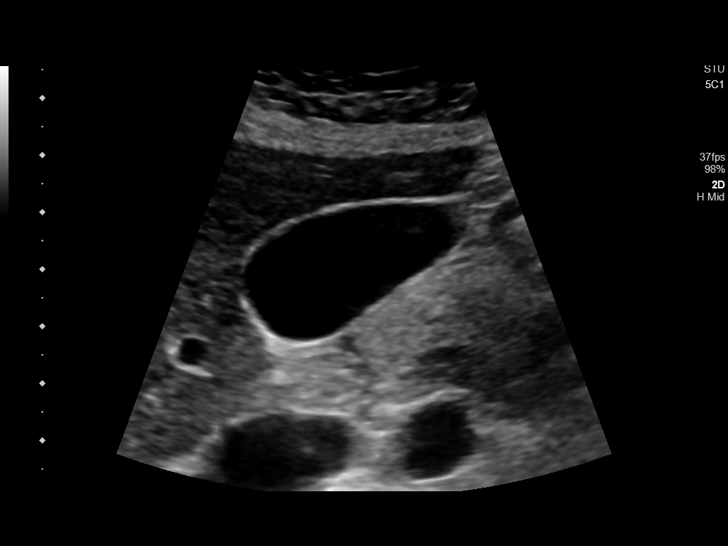
[im 15/58]
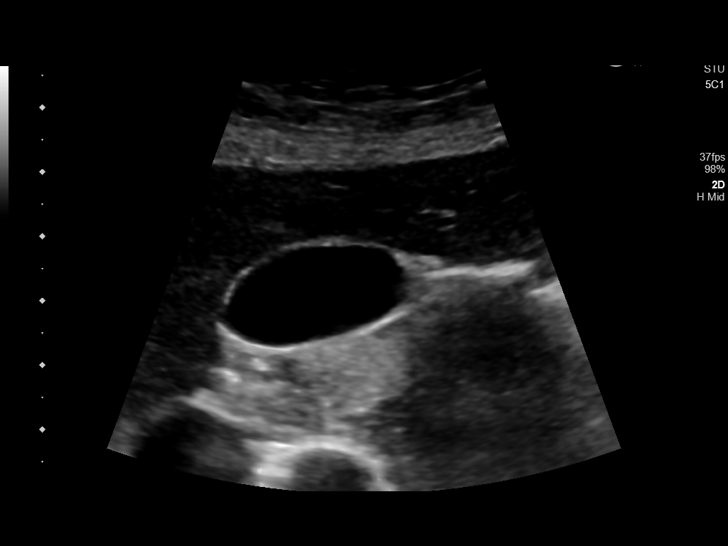
[im 20/58]
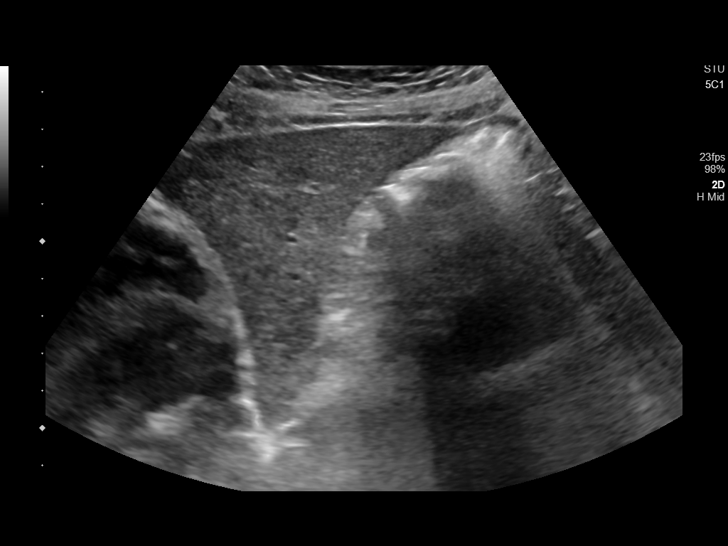
[im 22/58]
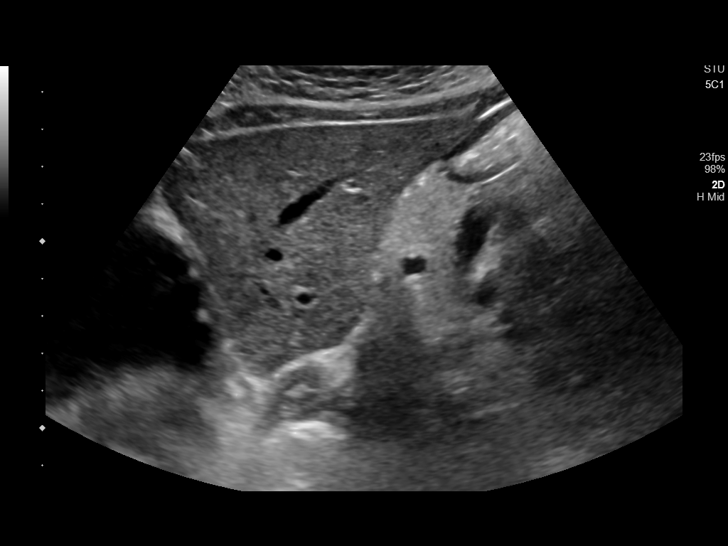
[im 27/58]
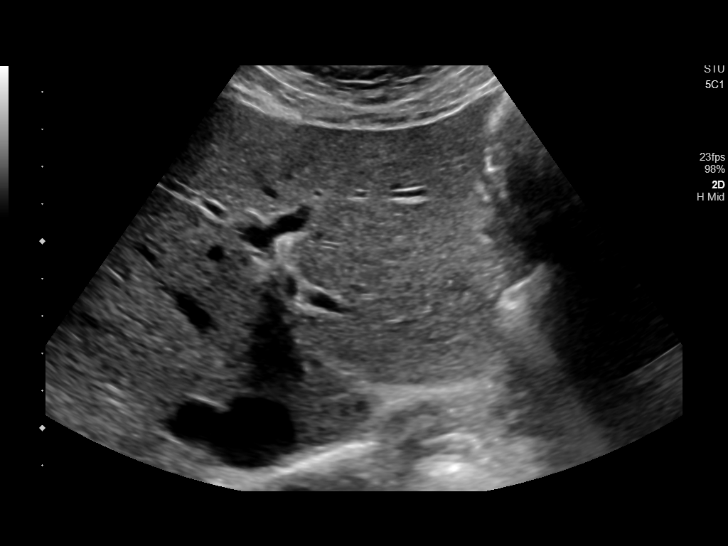
[im 31/58]
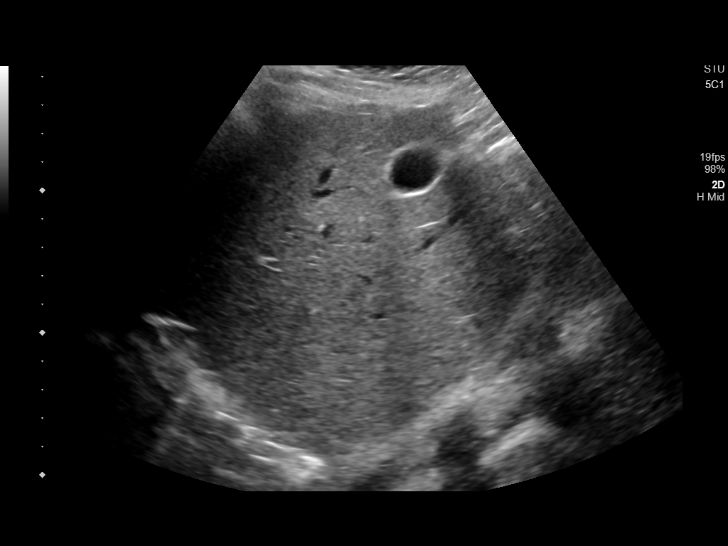
[im 36/58]
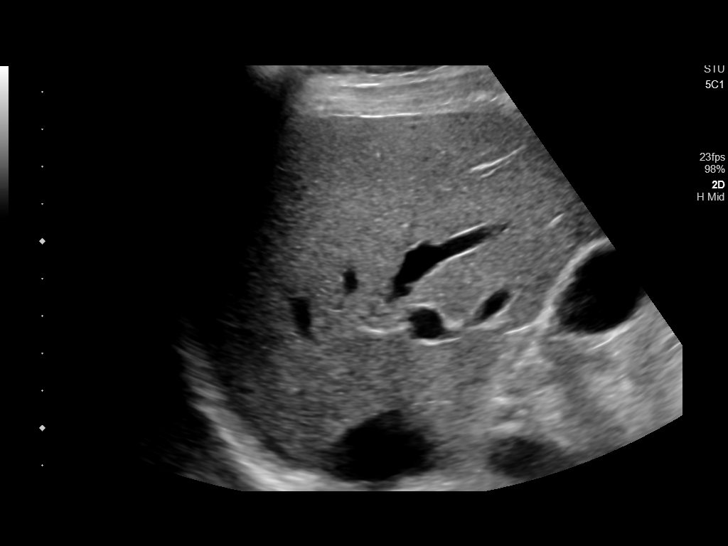
[im 39/58]
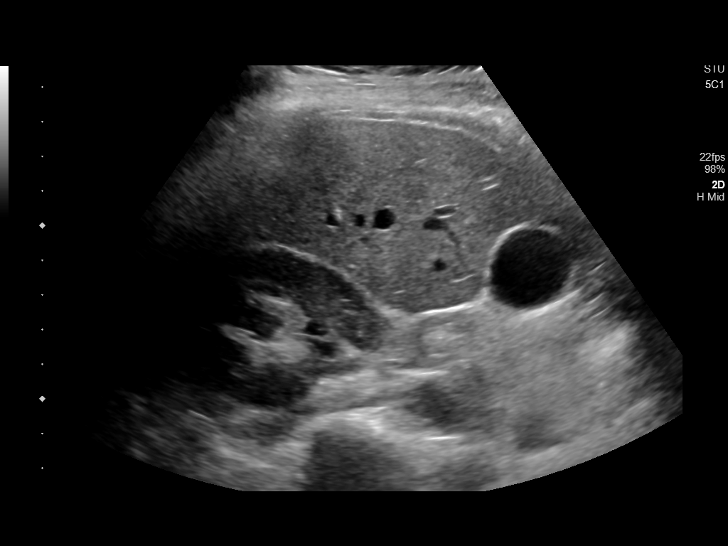
[im 43/58]
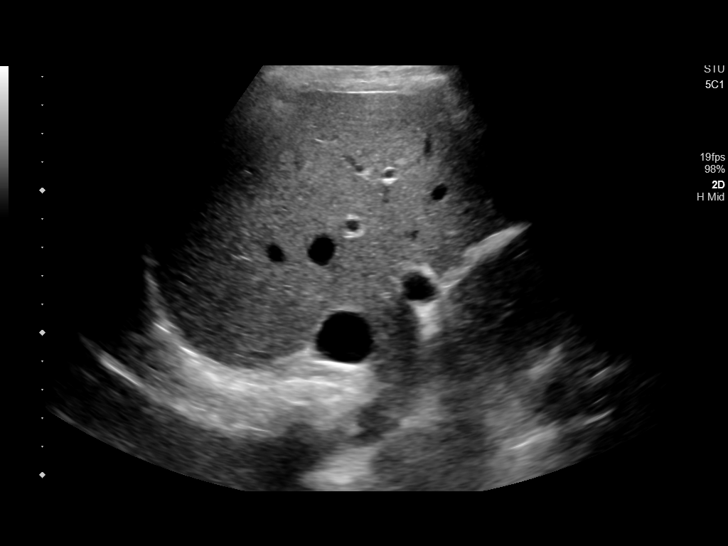
[im 48/58]
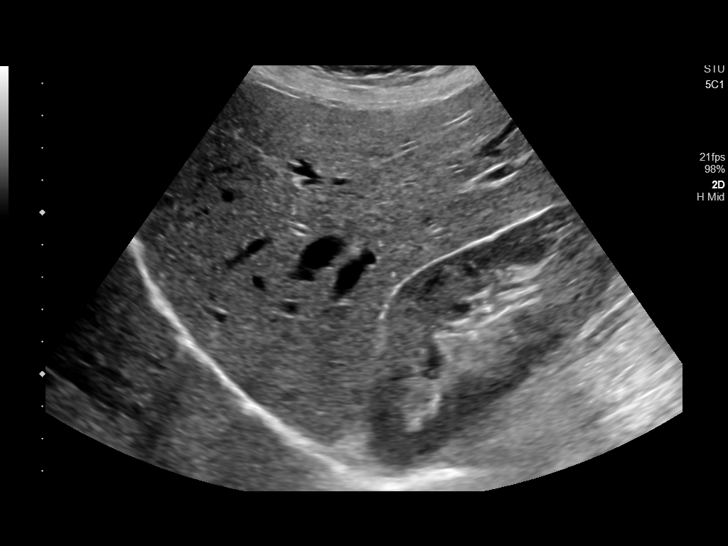
[im 53/58]
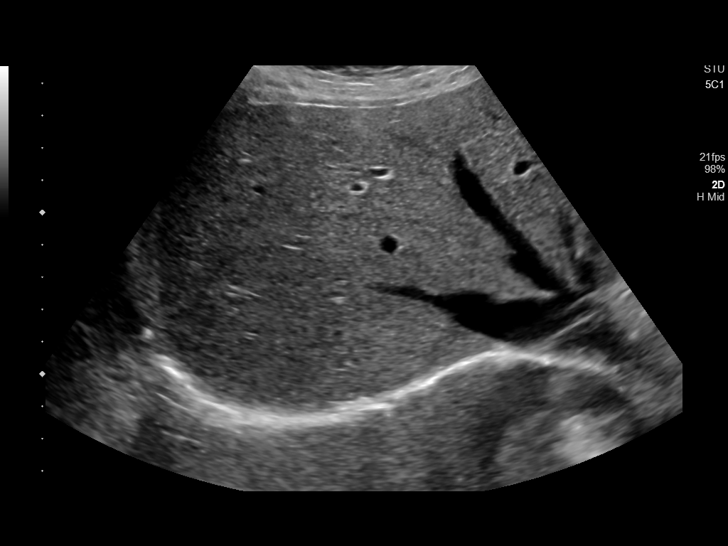
[im 58/58]
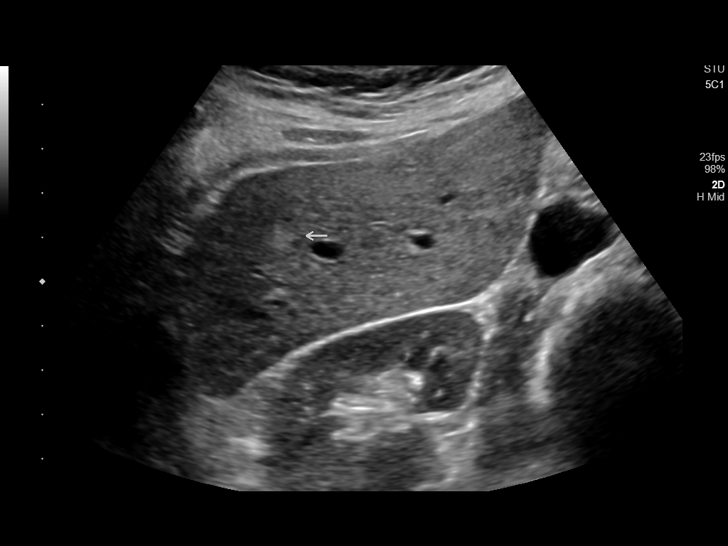

[14 of 25 positions shown; findings below may reference images not displayed]

FINDINGS: Gallbladder:

No gallstones or wall thickening visualized. There is no perihepatic
fluid. No sonographic Murphy sign noted by sonographer.

Common bile duct:

Diameter: 4 mm. No intrahepatic or extrahepatic biliary duct
dilatation.

Liver:

There is an echogenic focus in the right lobe of the liver near the
hepatorenal fossa measuring 1.5 x 1.2 x 1.0 cm. A second uniformly
echogenic focus is noted in the right lobe measuring 0.9 x 0.6 x
cm. Within normal limits in parenchymal echogenicity. Portal vein
is patent on color Doppler imaging with normal direction of blood
flow towards the liver.

Other: None.
IMPRESSION: Probable small hemangiomas in the right lobe of the liver. As these
suspected hemangiomas have not been documented previously, a
follow-up ultrasound of the liver in 1 year to assess for stability
is felt to be advisable.

Study otherwise unremarkable.

## 2022-12-13 ENCOUNTER — Ambulatory Visit: Payer: Self-pay

## 2022-12-15 ENCOUNTER — Encounter: Payer: Self-pay | Admitting: Emergency Medicine

## 2022-12-15 ENCOUNTER — Ambulatory Visit
Admission: EM | Admit: 2022-12-15 | Discharge: 2022-12-15 | Disposition: A | Discharge status: Home / Self Care | Payer: BC Managed Care – PPO | Attending: Emergency Medicine | Admitting: Emergency Medicine

## 2022-12-15 DIAGNOSIS — J029 Acute pharyngitis, unspecified: Secondary | ICD-10-CM | POA: Insufficient documentation

## 2022-12-15 LAB — GROUP A STREP BY PCR: Group A Strep by PCR: NOT DETECTED

## 2022-12-15 NOTE — Discharge Instructions (Addendum)
Your strep test today was negative so your sore throat is most likely being caused by a respiratory virus.  Gargle with warm salt water 2-3 times a day to soothe your throat, aid in pain relief, and aid in healing.  Take over-the-counter ibuprofen according to the package instructions as needed for pain.  You can also use Chloraseptic or Sucrets lozenges, 1 lozenge every 2 hours as needed for throat pain.  If you develop any new or worsening symptoms return for reevaluation.

## 2022-12-15 NOTE — ED Provider Notes (Signed)
[facility]    CSN: [id number] Arrival date & time: 12/15/22  1104      History   Chief Complaint Chief Complaint  Patient presents with   Sore Throat    HPI Janice Vargas is a 42 y.o. female.   HPI  42 year old female with a past medical history significant for MVP and tricuspid valve prolapse presents for evaluation of 2 days worth of sore throat.  She reports that she has been taking ibuprofen around-the-clock so she is unaware if she has had a fever or not but she denies runny nose, nasal congestion, or cough.  Her daughter was recently treated for strep.  Past Medical History:  Diagnosis Date   Headache    high pressure headaches but does not take anything anymore   Heart murmur    not being followed   Mitral valve prolapse    Tricuspid valve prolapse     Patient Active Problem List   Diagnosis Date Noted   Heart murmur 02/20/2019   Intramural leiomyoma of uterus 02/20/2019   Closed fracture of phalanx of foot 11/07/2016   Chronic pain of right knee 11/05/2015    Past Surgical History:  Procedure Laterality Date   ABDOMINAL HYSTERECTOMY     DILATION AND CURETTAGE OF UTERUS  2001   FRACTURE SURGERY Bilateral 1999   screws and plates in both feet   LAPAROSCOPIC BILATERAL SALPINGECTOMY Bilateral 01/11/2016   Procedure: LAPAROSCOPIC BILATERAL SALPINGECTOMY;  Surgeon: Elenora Fender Ward, MD;  Location: [facility]S;  Service: Gynecology;  Laterality: Bilateral;   LAPAROSCOPIC HYSTERECTOMY N/A 01/11/2016   Procedure: HYSTERECTOMY TOTAL LAPAROSCOPIC;  Surgeon: Elenora Fender Ward, MD;  Location: [facility]S;  Service: Gynecology;  Laterality: N/A;   OVARIAN CYST REMOVAL  1986   WISDOM TOOTH EXTRACTION  2002    OB History   No obstetric history on file.      Home Medications    Prior to Admission medications   Medication Sig Start Date End Date Taking? Authorizing Provider  cetirizine (ZYRTEC) 10 MG tablet Take 10 mg by mouth daily.    [provider]  dicyclomine (BENTYL) 20 MG tablet Take 1 tablet (20 mg total) by mouth 2 (two) times daily. 06/12/19   Verlee Monte, NP  EPINEPHrine 0.3 mg/0.3 mL IJ SOAJ injection INJECT AS DIRECTED ONLY DURING ANAPHYLACTIC REACTION. 04/25/19   [provider]  fluticasone (FLONASE) 50 MCG/ACT nasal spray Place 1 spray into both nostrils daily as needed for allergies. 12/31/15   [provider]  ibuprofen (ADVIL,MOTRIN) 600 MG tablet Take 1 tablet (600 mg total) by mouth every 6 (six) hours as needed. 03/02/18   Domenick Gong, MD  levocetirizine (XYZAL) 5 MG tablet Take 1 tablet by mouth daily as needed.    [provider]  Multiple Vitamin (MULTIVITAMIN WITH MINERALS) TABS tablet Take 1 tablet by mouth daily.    [provider]  omeprazole (PRILOSEC OTC) 20 MG tablet Take by mouth. 01/02/17   [provider]  Pancrelipase, Lip-Prot-Amyl, (ZENPEP) 40000-126000 units CPEP Take 2 capsules with each meal and 1 capsule with a snack 07/25/19   Midge Minium, MD    Family History Family History  Problem Relation Age of Onset   Diabetic kidney disease Father    Hypertension Father    Seizures Son    Healthy Mother     Social History Social History   Tobacco Use   Smoking status: Former    Current packs/day: 0.00  Types: Cigarettes    Quit date: 02/15/2001    Years since quitting: 21.8   Smokeless tobacco: Never  Vaping Use   Vaping status: Never Used  Substance Use Topics   Alcohol use: Yes    Comment: socially   Drug use: No     Allergies   Sulfa antibiotics, Bupropion, Latex, and Omnicef [cefdinir]   Review of Systems Review of Systems  Constitutional:  Negative for fever.  HENT:  Positive for sore throat. Negative for congestion, ear pain and rhinorrhea.   Respiratory:  Negative for cough.      Physical Exam Triage Vital Signs ED Triage Vitals  Encounter Vitals Group     BP      Systolic BP Percentile      Diastolic BP Percentile       Pulse      Resp      Temp      Temp src      SpO2      Weight      Height      Head Circumference      Peak Flow      Pain Score      Pain Loc      Pain Education      Exclude from Growth Chart    No data found.  Updated Vital Signs BP 129/75 (BP Location: Left Arm)   Pulse 66   Temp 98.3 F (36.8 C) (Oral)   Resp 16   LMP 01/01/2016 (Exact Date)   SpO2 100%   Visual Acuity Right Eye Distance:   Left Eye Distance:   Bilateral Distance:    Right Eye Near:   Left Eye Near:    Bilateral Near:     Physical Exam Vitals and nursing note reviewed.  Constitutional:      Appearance: Normal appearance. She is not ill-appearing.  HENT:     Head: Normocephalic and atraumatic.     Mouth/Throat:     Mouth: Mucous membranes are moist.     Pharynx: Oropharynx is clear. Posterior oropharyngeal erythema present. No oropharyngeal exudate.  Cardiovascular:     Rate and Rhythm: Normal rate and regular rhythm.     Pulses: Normal pulses.     Heart sounds: Normal heart sounds. No murmur heard.    No friction rub. No gallop.  Pulmonary:     Effort: Pulmonary effort is normal.     Breath sounds: Normal breath sounds. No wheezing, rhonchi or rales.  Musculoskeletal:     Cervical back: Normal range of motion and neck supple. No tenderness.  Lymphadenopathy:     Cervical: No cervical adenopathy.  Skin:    General: Skin is warm and dry.     Capillary Refill: Capillary refill takes less than 2 seconds.     Findings: No rash.  Neurological:     General: No focal deficit present.     Mental Status: She is alert and oriented to person, place, and time.      UC Treatments / Results  Labs (all labs ordered are listed, but only abnormal results are displayed) Labs Reviewed  GROUP A STREP BY PCR    EKG   Radiology No results found.  Procedures Procedures (including critical care time)  Medications Ordered in UC Medications - No data to display  Initial Impression /  Assessment and Plan / UC Course  I have reviewed the triage vital signs and the nursing notes.  Pertinent labs & imaging results that  were available during my care of the patient were reviewed by me and considered in my medical decision making (see chart for details).   Patient is a pleasant 42 year old female with significant cardiac history presenting for evaluation of sore throat strep.  She is not experiencing any runny nose, nasal congestion, or cough.  She reports that her throat is significantly sore and that her swallowing is painful.  She has been taking ibuprofen around-the-clock to help with the pain and she is unaware of any fevers.  On exam her tonsillar pillars are unremarkable though there is erythema and injection to the posterior oropharynx as well as her soft palate.  No cervical lymphadenopathy present on exam.  Cardiopulmonary exam is benign.  I will order a strep PCR to evaluate for the presence of strep.  Strep PCR is negative.  I will discharge patient with a diagnosis of viral pharyngitis.  Supportive care discussed.  Return precautions reviewed.   Final Clinical Impressions(s) / UC Diagnoses   Final diagnoses:  Viral pharyngitis     Discharge Instructions      Your strep test today was negative so your sore throat is most likely being caused by a respiratory virus.  Gargle with warm salt water 2-3 times a day to soothe your throat, aid in pain relief, and aid in healing.  Take over-the-counter ibuprofen according to the package instructions as needed for pain.  You can also use Chloraseptic or Sucrets lozenges, 1 lozenge every 2 hours as needed for throat pain.  If you develop any new or worsening symptoms return for reevaluation.      ED Prescriptions   None    PDMP not reviewed this encounter.   Becky Augusta, NP 12/15/22 1248    Becky Augusta, NP 12/16/22 [phone]

## 2022-12-15 NOTE — ED Triage Notes (Signed)
Pt presents with a sore throat x 2 days  

## 2023-02-13 ENCOUNTER — Ambulatory Visit
Admission: EM | Admit: 2023-02-13 | Discharge: 2023-02-13 | Discharge status: Against Medical Advice | Payer: BC Managed Care – PPO | Attending: Emergency Medicine | Admitting: Emergency Medicine
